# Patient Record
Sex: Male | Born: 1970 | Race: White | Hispanic: No | Smoking: Former smoker
Health system: Southern US, Community
[De-identification: ages and names within clinical notes are randomized; demographics above are authoritative.]

## PROBLEM LIST (undated history)

## (undated) HISTORY — PX: TENDON REPAIR: SHX5111

## (undated) HISTORY — PX: WISDOM TOOTH EXTRACTION: SHX21

---

## 2009-10-17 ENCOUNTER — Ambulatory Visit: Payer: Self-pay | Admitting: Orthopedic Surgery

## 2009-10-18 ENCOUNTER — Ambulatory Visit: Payer: Self-pay | Admitting: Orthopedic Surgery

## 2012-08-20 ENCOUNTER — Ambulatory Visit: Payer: Self-pay | Admitting: General Surgery

## 2012-08-20 LAB — COMPREHENSIVE METABOLIC PANEL
Albumin: 4.4 g/dL (ref 3.4–5.0)
Anion Gap: 7 (ref 7–16)
BUN: 15 mg/dL (ref 7–18)
Bilirubin,Total: 1.2 mg/dL — ABNORMAL HIGH (ref 0.2–1.0)
Calcium, Total: 9.2 mg/dL (ref 8.5–10.1)
Chloride: 106 mmol/L (ref 98–107)
Co2: 25 mmol/L (ref 21–32)
EGFR (African American): >60
EGFR (Non-African Amer.): >60
Potassium: 3.9 mmol/L (ref 3.5–5.1)
SGOT(AST): 16 U/L (ref 15–37)
Sodium: 138 mmol/L (ref 136–145)
Total Protein: 7.5 g/dL (ref 6.4–8.2)

## 2012-08-20 LAB — URINALYSIS, COMPLETE
Ketone: NEGATIVE
Nitrite: NEGATIVE
Ph: 7 (ref 4.5–8.0)
Protein: NEGATIVE
Specific Gravity: 1.003 (ref 1.003–1.030)
WBC UR: <1 /HPF (ref 0–5)

## 2013-05-09 ENCOUNTER — Ambulatory Visit: Payer: Self-pay | Admitting: Family Medicine

## 2013-05-10 ENCOUNTER — Other Ambulatory Visit: Payer: Self-pay | Admitting: Orthopedic Surgery

## 2013-05-10 DIAGNOSIS — M79644 Pain in right finger(s): Secondary | ICD-10-CM

## 2013-05-19 ENCOUNTER — Ambulatory Visit
Admission: RE | Admit: 2013-05-19 | Discharge: 2013-05-19 | Disposition: A | Discharge status: Home / Self Care | Payer: Managed Care, Other (non HMO) | Source: Ambulatory Visit | Attending: Orthopedic Surgery | Admitting: Orthopedic Surgery

## 2013-05-19 ENCOUNTER — Other Ambulatory Visit: Payer: Self-pay | Admitting: Orthopedic Surgery

## 2013-05-19 DIAGNOSIS — Z77018 Contact with and (suspected) exposure to other hazardous metals: Secondary | ICD-10-CM

## 2013-05-19 DIAGNOSIS — M79644 Pain in right finger(s): Secondary | ICD-10-CM

## 2013-05-19 MED ORDER — IOHEXOL 180 MG/ML  SOLN
1.5000 mL | Freq: Once | INTRAMUSCULAR | Status: AC | PRN
  Administered 2013-05-19: 0.5 mL via INTRA_ARTICULAR

## 2013-10-31 ENCOUNTER — Emergency Department: Payer: Self-pay | Admitting: Emergency Medicine

## 2015-02-12 ENCOUNTER — Ambulatory Visit: Payer: Self-pay | Admitting: Family Medicine

## 2015-02-12 LAB — HEPATIC FUNCTION PANEL
ALK PHOS: 75 U/L (ref 25–125)
ALT: 15 U/L (ref 10–40)
AST: 20 U/L (ref 14–40)
Bilirubin, Total: 1.5 mg/dL

## 2015-02-12 LAB — BASIC METABOLIC PANEL
BUN: 12 mg/dL (ref 4–21)
Creatinine: 1 mg/dL (ref 0.6–1.3)
Glucose: 93 mg/dL
POTASSIUM: 4.7 mmol/L (ref 3.4–5.3)
SODIUM: 141 mmol/L (ref 137–147)

## 2015-02-12 LAB — CBC AND DIFFERENTIAL
HEMATOCRIT: 44 % (ref 41–53)
HEMOGLOBIN: 15.2 g/dL (ref 13.5–17.5)
Neutrophils Absolute: 4 /uL
Platelets: 293 10*3/uL (ref 150–399)
WBC: 5.5 10^3/mL

## 2015-02-12 LAB — LIPID PANEL
CHOLESTEROL: 239 mg/dL — AB (ref 0–200)
HDL: 52 mg/dL (ref 35–70)
LDL Cholesterol: 154 mg/dL
LDl/HDL Ratio: 3
Triglycerides: 167 mg/dL — AB (ref 40–160)

## 2015-02-12 LAB — PSA: PSA: 2.8

## 2015-02-12 LAB — TSH: TSH: 1.57 u[IU]/mL (ref 0.41–5.90)

## 2015-04-10 NOTE — Op Note (Signed)
PATIENT NAME:  Christian Jensen, [name] MR#:  [id number] DATE OF BIRTH:  [birth year]  DATE OF PROCEDURE:  08/20/2012  PREOPERATIVE DIAGNOSIS: Bilateral inguinal hernias.   POSTOPERATIVE DIAGNOSIS: Right inguinal hernias (direct) and lipoma of the cord.   OPERATIVE PROCEDURE: Bilateral direct inguinal hernia repair making use of a medium Ultrapro mesh.   SURGEON: Donnalee CurryJeffrey Lenay Lovejoy, MD   ANESTHESIA: General endotracheal under Dr. Noralyn Pickarroll, Exparel 20 mL local infiltration, 1% Xylocaine with 1:200,000 units epinephrine 30 mL local infiltration.   ESTIMATED BLOOD LOSS: Less than 5 mL.   CLINICAL NOTE: This 44 year old male has developed a symptomatic left inguinal hernia. Clinical exam showed bilateral inguinal hernias. He was admitted for elective repair. He received Kefzol prior to the procedure.   OPERATIVE NOTE: With the patient under adequate general endotracheal anesthesia and with hair previously removed with clippers, the area was prepped with ChloraPrep and draped. The left groin, which was more symptomatic, was approached first. A 5 cm skin line incision was made over the planned course of the inguinal canal and was carried down through the skin and subcutaneous tissue with hemostasis achieved by electrocautery and 3-0 Vicryl ties. The external oblique was opened in the direction of its fibers. The cord was mobilized. Dissection at the level of the internal inguinal ring showed no evidence of an indirect sac. There was a small lipoma of the cord which was excised and discarded. Hemostasis was with 3-0 Vicryl ligature. The medial aspect of the posterior canal was lax and the tissue was opened to allow exposure to the preperitoneal space. A medium Ultrapro mesh was smoothed into position and the external component laid along the inguinal canal. This was anchored to the pubic tubercle with 0 Surgilon and then along the inguinal ligament with interrupted 0 Surgilon sutures. The medial and superior borders were  anchored to the transverse abdominis aponeurosis. A lateral slit was made for cord passage. Exparel was placed into the wound at this time. 15 mL of the Xylocaine with epinephrine had been used for the skin and field block anesthesia at the initiation of the procedure. The wound was closed in layers with 2-0 Vicryl to the external oblique, 3-0 Vicryl to the Scarpa's fascia and a running 4-0 Vicryl subcuticular suture for the skin. Benzoin, Steri-Strips, Telfa, and Tegaderm dressing was then applied.   Attention was then turned to the right groin. Field block anesthesia was once again placed with Xylocaine. A 5 cm incision was made and the dissection was completed as described for the left side. Once again a small direct hernia with lipoma of the cord was identified. No indirect sac. The medium Ultrapro mesh was again placed in the preperitoneal space and the external component anchored, as noted above, on the left side. The wound was closed in layers as noted for the left side. Benzoin, Steri-Strips, Telfa, and Tegaderm dressing was then applied.   The patient tolerated the procedure well and was taken to the recovery room in stable condition.  ____________________________ Earline MayotteJeffrey W. Karissa Meenan, MD jwb:slb D: 08/20/2012 14:05:14 ET T: 08/20/2012 14:15:13 ET JOB#: [id number]  cc: Earline MayotteJeffrey W. Sheila Ocasio, MD, <Dictator> Richard L. Sullivan LoneGilbert, MD Eliah Ozawa Brion AlimentW Tadarrius Burch MD ELECTRONICALLY SIGNED 08/20/2012 14:36

## 2015-10-19 ENCOUNTER — Ambulatory Visit (INDEPENDENT_AMBULATORY_CARE_PROVIDER_SITE_OTHER): Payer: Managed Care, Other (non HMO) | Admitting: Family Medicine

## 2015-10-19 ENCOUNTER — Encounter: Payer: Self-pay | Admitting: Family Medicine

## 2015-10-19 VITALS — BP 124/70 | HR 99 | Temp 99.2°F | Resp 16 | Ht 71.75 in | Wt 175.0 lb

## 2015-10-19 DIAGNOSIS — M71122 Other infective bursitis, left elbow: Secondary | ICD-10-CM | POA: Diagnosis not present

## 2015-10-19 DIAGNOSIS — M71129 Other infective bursitis, unspecified elbow: Secondary | ICD-10-CM | POA: Insufficient documentation

## 2015-10-19 DIAGNOSIS — M25522 Pain in left elbow: Secondary | ICD-10-CM

## 2015-10-19 NOTE — Progress Notes (Signed)
Patient ID: Christian Jensen, male   DOB: 10/07/1971, 44 y.o.   MRN: [id number]         Patient: Christian Jensen Male    DOB: 10/01/1971   44 y.o.   MRN: [id number] Visit Date: 10/19/2015  Today's Provider: Lorie PhenixNancy Kieffer Blatz, MD   Chief Complaint  Patient presents with  . Elbow Pain   Subjective:    Arm Pain  The incident occurred 2 days ago. There was no injury mechanism. The pain is present in the left elbow. The quality of the pain is described as aching (Throbbing.). The pain is at a severity of 8/10. The pain is severe. The pain has been constant since the incident. The symptoms are aggravated by movement and palpation. He has tried nothing for the symptoms.      Allergies not on file Previous Medications   OMEPRAZOLE 20 MG TBEC    Take by mouth.    Review of Systems  Constitutional: Positive for fever (Low grade fever.) and fatigue. Negative for chills, diaphoresis, activity change, appetite change and unexpected weight change.  Musculoskeletal: Positive for joint swelling and arthralgias. Negative for myalgias, back pain, gait problem, neck pain and neck stiffness.    Social History  Substance Use Topics  . Smoking status: Current Every Day Smoker  . Smokeless tobacco: Never Used  . Alcohol Use: Yes     Comment: Occasional    Objective:   BP 124/70 mmHg  Pulse 99  Temp(Src) 99.2 F (37.3 C) (Oral)  Resp 16  Ht 5' 11.75" (1.822 m)  Wt 175 lb (79.379 kg)  BMI 23.91 kg/m2  Physical Exam  Constitutional: He is oriented to person, place, and time. He appears well-developed and well-nourished.  Cardiovascular: Normal rate and regular rhythm.   Pulmonary/Chest: Effort normal and breath sounds normal.  Musculoskeletal: He exhibits edema and tenderness.  Erythema and tender in left olecranon bursa.    Neurological: He is alert and oriented to person, place, and time.        Assessment & Plan:     1. Elbow pain, left Secondary to bursitis.   2. Septic olecranon  bursitis, left Patient with mildly elevated heart rate and temperature. Concerning for infection. Will refer to Orthopedics today.  - Ambulatory referral to Orthopedic Surgery     Lorie PhenixNancy Einar Nolasco, MD  [facility]

## 2015-10-22 ENCOUNTER — Other Ambulatory Visit
Admission: RE | Admit: 2015-10-22 | Discharge: 2015-10-22 | Disposition: A | Discharge status: Home / Self Care | Payer: Managed Care, Other (non HMO) | Source: Other Acute Inpatient Hospital | Attending: Specialist | Admitting: Specialist

## 2015-10-22 DIAGNOSIS — Z029 Encounter for administrative examinations, unspecified: Secondary | ICD-10-CM | POA: Insufficient documentation

## 2015-10-24 LAB — WOUND CULTURE

## 2015-10-26 LAB — ANAEROBIC CULTURE

## 2016-02-21 ENCOUNTER — Encounter: Payer: Self-pay | Admitting: Family Medicine

## 2016-02-21 ENCOUNTER — Ambulatory Visit (INDEPENDENT_AMBULATORY_CARE_PROVIDER_SITE_OTHER): Payer: Managed Care, Other (non HMO) | Admitting: Family Medicine

## 2016-02-21 VITALS — BP 132/88 | HR 82 | Temp 97.7°F | Resp 16 | Wt 188.0 lb

## 2016-02-21 DIAGNOSIS — J029 Acute pharyngitis, unspecified: Secondary | ICD-10-CM | POA: Diagnosis not present

## 2016-02-21 DIAGNOSIS — R61 Generalized hyperhidrosis: Secondary | ICD-10-CM | POA: Insufficient documentation

## 2016-02-21 DIAGNOSIS — I861 Scrotal varices: Secondary | ICD-10-CM | POA: Insufficient documentation

## 2016-02-21 DIAGNOSIS — K469 Unspecified abdominal hernia without obstruction or gangrene: Secondary | ICD-10-CM | POA: Insufficient documentation

## 2016-02-21 LAB — POCT RAPID STREP A (OFFICE): Rapid Strep A Screen: NEGATIVE

## 2016-02-21 NOTE — Progress Notes (Signed)
Patient ID: Christian Jensen, male   DOB: 01/31/1971, 45 y.o.   MRN: [id number]    Subjective:  HPI Sore throat: Pt reports that he started having a sore throat 2 days ago. He reports that his throat looks red and he saw white spots on the back of his throat. He denies fevers, chills or body aches. He does have congestion and cough and has been " feeling hot". He has been taking OTC cold meds.   Prior to Admission medications   Medication Sig Start Date End Date Taking? Authorizing Provider  Omeprazole 20 MG TBEC Take by mouth.   Yes Historical Provider, MD    Patient Active Problem List   Diagnosis Date Noted  . Abdominal hernia 02/21/2016  . 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 Night sweat 02/21/2016  . Septic olecranon bursitis 10/19/2015  . Esophagitis, reflux 09/25/2008  . Carbuncle and furuncle 05/11/2008    History reviewed. No pertinent past medical history.  Social History   Social History  . Marital Status: Divorced    Spouse Name: N/A  . Number of Children: N/A  . Years of Education: N/A   Occupational History  . Not on file.   Social History Main Topics  . Smoking status: Former Smoker    Quit date: 10/23/2015  . Smokeless tobacco: Not on file  . Alcohol Use: Yes     Comment: Occasional   . Drug Use: No  . Sexual Activity: Not on file   Other Topics Concern  . Not on file   Social History Narrative    Not on File  Review of Systems  Constitutional: Positive for malaise/fatigue.  HENT: Positive for congestion and sore throat.   Eyes: Negative.   Respiratory: Positive for cough.   Cardiovascular: Negative.   Gastrointestinal: Negative.   Genitourinary: Negative.   Musculoskeletal: Negative.   Skin: Negative.   Neurological: Negative.   Endo/Heme/Allergies: Negative.   Psychiatric/Behavioral: Negative.     Immunization History  Administered Date(s) Administered  . Tdap 09/25/2008   Objective:  BP 132/88 mmHg  Pulse 82  Temp(Src) 97.7 F  (36.5 C) (Oral)  Resp 16  Wt 188 lb (85.276 kg)  SpO2 99%  Physical Exam  Constitutional: He is oriented to person, place, and time and well-developed, well-nourished, and in no distress.  HENT:  Head: Normocephalic and atraumatic.  Right Ear: External ear normal.  Left Ear: External ear normal.  Nose: Nose normal.  Mouth/Throat: Oropharynx is clear and moist.  Cobblestoning and erythema appearance and the uvula is mildly inflamed and swollen.  Eyes: Conjunctivae are normal.  Neck: Neck supple.  Cardiovascular: Normal rate, regular rhythm and normal heart sounds.   Pulmonary/Chest: Effort normal and breath sounds normal.  Abdominal: Soft.  Neurological: He is alert and oriented to person, place, and time.  Skin: Skin is warm and dry.  Psychiatric: Mood, memory, affect and judgment normal.    Lab Results  Component Value Date   WBC 5.5 02/12/2015   HGB 15.2 02/12/2015   HCT 44 02/12/2015   PLT 293 02/12/2015   GLUCOSE 105* 08/20/2012   CHOL 239* 02/12/2015   TRIG 167* 02/12/2015   HDL 52 02/12/2015   LDLCALC 154 02/12/2015   TSH 1.57 02/12/2015   PSA 2.8 02/12/2015    CMP     Component Value Date/Time   NA 141 02/12/2015   NA 138 08/20/2012 0819   K 4.7 02/12/2015   K 3.9 08/20/2012 0819   CL 106  08/20/2012 0819   CO2 25 08/20/2012 0819   GLUCOSE 105* 08/20/2012 0819   BUN 12 02/12/2015   BUN 15 08/20/2012 0819   CREATININE 1.0 02/12/2015   CREATININE 0.80 08/20/2012 0819   CALCIUM 9.2 08/20/2012 0819   PROT 7.5 08/20/2012 0819   ALBUMIN 4.4 08/20/2012 0819   AST 20 02/12/2015   AST 16 08/20/2012 0819   ALT 15 02/12/2015   ALT 29 08/20/2012 0819   ALKPHOS 75 02/12/2015   ALKPHOS 77 08/20/2012 0819   BILITOT 1.2* 08/20/2012 0819   GFRNONAA >60 08/20/2012 0819   GFRAA >60 08/20/2012 0819    Assessment and Plan :  1. Sore throat URI with viral pharyngitis and uvulitis. Push fluids and plenty of rest. Antibodies will not be helpful at this time. -  POCT rapid strep A--negative.  I have done the exam and reviewed the above chart and it is accurate to the best of my knowledge.  Julieanne Manson MD [facility] 02/21/2016 10:11 AM

## 2016-07-18 ENCOUNTER — Other Ambulatory Visit: Payer: Self-pay | Admitting: Orthopedic Surgery

## 2016-07-18 DIAGNOSIS — S46212A Strain of muscle, fascia and tendon of other parts of biceps, left arm, initial encounter: Secondary | ICD-10-CM

## 2016-07-24 ENCOUNTER — Ambulatory Visit
Admission: RE | Admit: 2016-07-24 | Discharge: 2016-07-24 | Disposition: A | Discharge status: Home / Self Care | Payer: Managed Care, Other (non HMO) | Source: Ambulatory Visit | Attending: Orthopedic Surgery | Admitting: Orthopedic Surgery

## 2016-07-24 DIAGNOSIS — M67824 Other specified disorders of tendon, left elbow: Secondary | ICD-10-CM | POA: Insufficient documentation

## 2016-07-24 DIAGNOSIS — S46212A Strain of muscle, fascia and tendon of other parts of biceps, left arm, initial encounter: Secondary | ICD-10-CM | POA: Diagnosis not present

## 2018-01-28 ENCOUNTER — Encounter: Payer: Self-pay | Admitting: Family Medicine

## 2018-01-28 ENCOUNTER — Ambulatory Visit: Payer: Managed Care, Other (non HMO) | Admitting: Family Medicine

## 2018-01-28 VITALS — BP 122/80 | HR 106 | Temp 97.7°F | Resp 16 | Ht 72.0 in | Wt 176.0 lb

## 2018-01-28 DIAGNOSIS — M436 Torticollis: Secondary | ICD-10-CM | POA: Diagnosis not present

## 2018-01-28 MED ORDER — CYCLOBENZAPRINE HCL 10 MG PO TABS: 10.0000 mg | ORAL_TABLET | Freq: Three times a day (TID) | ORAL | 1 refills | Status: DC | PRN

## 2018-01-28 MED ORDER — HYDROCODONE-ACETAMINOPHEN 5-325 MG PO TABS
1.0000 | ORAL_TABLET | Freq: Four times a day (QID) | ORAL | 0 refills | Status: DC | PRN
Start: 2018-01-28 — End: 2018-02-05

## 2018-01-28 MED ORDER — PREDNISONE 10 MG (21) PO TBPK: ORAL_TABLET | ORAL | 0 refills | Status: DC

## 2018-01-28 NOTE — Progress Notes (Signed)
Patient: Christian RaymondJason M Jensen Male    DOB: 06/12/1971   47 y.o.   MRN: [id number] Visit Date: 01/28/2018  Today's Provider: Megan Mansichard Zayli Villafuerte Jr, MD   Chief Complaint  Patient presents with  . Shoulder Pain   Subjective:    HPI Pt reports that he started having right shoulder pain about 2 weeks ago and he had a bad creak in his neck and he went to the chiropractor and back and leg are straight but his neck is still not straight. He is now having pain in his shoulder area now. He reports that he has been out of work for the last 3 days because of this. He thought maybe moving might help it so he did some yard work and that made it worse. The chiropractor said that he had pulled something in his arm. He reports that he can not sleep at night because he can not get comfortable. He reports that he has been taking ibuprofen and it is not working at all.       Not on File   Current Outpatient Medications:  .  Omeprazole 20 MG TBEC, Take by mouth., Disp: , Rfl:   Review of Systems  Constitutional: Negative.   HENT: Negative.   Eyes: Negative.   Respiratory: Negative.   Cardiovascular: Negative.   Gastrointestinal: Negative.   Endocrine: Negative.   Genitourinary: Negative.   Musculoskeletal: Positive for arthralgias, back pain, myalgias, neck pain and neck stiffness.  Skin: Negative.   Allergic/Immunologic: Negative.   Neurological: Negative.   Hematological: Negative.   Psychiatric/Behavioral: Negative.     Social History   Tobacco Use  . Smoking status: Current Every Day Smoker    Packs/day: 0.25    Last attempt to quit: 10/23/2015    Years since quitting: 2.2  . Smokeless tobacco: Never Used  Substance Use Topics  . Alcohol use: Yes    Comment: weekends   Objective:   BP 122/80 (BP Location: Left Arm, Patient Position: Sitting, Cuff Size: Normal)   Pulse (!) 106   Temp 97.7 F (36.5 C) (Oral)   Resp 16   Ht 6' (1.829 m)   Wt 176 lb (79.8 kg)   BMI 23.87 kg/m   Vitals:   01/28/18 0821  BP: 122/80  Pulse: (!) 106  Resp: 16  Temp: 97.7 F (36.5 C)  TempSrc: Oral  Weight: 176 lb (79.8 kg)  Height: 6' (1.829 m)     Physical Exam  Constitutional: He is oriented to person, place, and time. He appears well-developed and well-nourished.  HENT:  Head: Normocephalic and atraumatic.  Right Ear: External ear normal.  Left Ear: External ear normal.  Nose: Nose normal.  Eyes: Conjunctivae are normal.  Neck: No thyromegaly present.  Cardiovascular: Normal rate, regular rhythm and normal heart sounds.  Pulmonary/Chest: Effort normal and breath sounds normal.  Abdominal: Soft.  Neurological: He is alert and oriented to person, place, and time.  Skin: Skin is warm and dry.  Psychiatric: He has a normal mood and affect. His behavior is normal. Judgment and thought content normal.        Assessment & Plan:     1. Torticollis  - HYDROcodone-acetaminophen (NORCO/VICODIN) 5-325 MG tablet; Take 1 tablet by mouth every 6 (six) hours as needed for moderate pain.  Dispense: 25 tablet; Refill: 0 - cyclobenzaprine (FLEXERIL) 10 MG tablet; Take 1 tablet (10 mg total) by mouth 3 (three) times daily as needed for muscle spasms.  Dispense: 30 tablet; Refill: 1 - predniSONE (STERAPRED UNI-PAK 21 TAB) 10 MG (21) TBPK tablet; Take 6 tablets on the first day then decrease by one daily until finished.  Dispense: 21 tablet; Refill: 0      I have done the exam and reviewed the above chart and it is accurate to the best of my knowledge. Dentist has been used in this note in any air is in the dictation or transcription are unintentional.  Megan Mans, MD  [facility] .

## 2018-02-02 ENCOUNTER — Telehealth: Payer: Self-pay | Admitting: Family Medicine

## 2018-02-02 NOTE — Telephone Encounter (Signed)
Patient states that he was in for a appointment on 01/28/2018 and you gave him prednisone for his shoulder.  He states that it seemed to help get the inflammation down but it is still swollen and has a knot on it.  He states that it is really bothering him today and has tingling in his arm and fingers and is hard to grip with his right arm.  He would like to know if there is something else you can send in for him.  [facility] [place].

## 2018-02-03 NOTE — Telephone Encounter (Signed)
Appt made

## 2018-02-03 NOTE — Telephone Encounter (Signed)
Can I see him tomorrow at 11 or in afternoon. May need further evaluation.

## 2018-02-03 NOTE — Telephone Encounter (Signed)
Pt called back because he hasn't heard back about [facility]. Please advise. Thanks TNP

## 2018-02-04 ENCOUNTER — Ambulatory Visit
Admission: RE | Admit: 2018-02-04 | Discharge: 2018-02-04 | Disposition: A | Discharge status: Home / Self Care | Payer: Managed Care, Other (non HMO) | Source: Ambulatory Visit | Attending: Family Medicine | Admitting: Family Medicine

## 2018-02-04 ENCOUNTER — Ambulatory Visit: Payer: Managed Care, Other (non HMO) | Admitting: Family Medicine

## 2018-02-04 VITALS — BP 122/82 | HR 92 | Temp 98.2°F | Resp 18 | Wt 181.0 lb

## 2018-02-04 DIAGNOSIS — M5412 Radiculopathy, cervical region: Secondary | ICD-10-CM

## 2018-02-04 DIAGNOSIS — M50322 Other cervical disc degeneration at C5-C6 level: Secondary | ICD-10-CM | POA: Diagnosis not present

## 2018-02-04 DIAGNOSIS — M8580 Other specified disorders of bone density and structure, unspecified site: Secondary | ICD-10-CM | POA: Diagnosis not present

## 2018-02-04 MED ORDER — DIAZEPAM 5 MG PO TABS: 5.0000 mg | ORAL_TABLET | Freq: Three times a day (TID) | ORAL | 0 refills | Status: DC | PRN

## 2018-02-04 MED ORDER — DIAZEPAM 5 MG PO TABS: 5.0000 mg | ORAL_TABLET | Freq: Four times a day (QID) | ORAL | 0 refills | Status: DC | PRN

## 2018-02-04 NOTE — Progress Notes (Signed)
Patient: Christian Jensen Male    DOB: 09/03/1971   46 y.o.   MRN: [id number] Visit Date: 02/04/2018  Today's Provider: Megan Mansichard [name], MD   Chief Complaint  Patient presents with  . Shoulder Pain   Subjective:    HPI Pt is here today to follow up on his shoulder he was seen on 01/28/18 for torticollis and given Norco # 25, flexeril and Prednisone. He reports that he is still hurting and it is more in his right shoulder now. He has numbness down his arm and fingers. He thinks it is a pinches nerve. He took the prednisone and he has taken all but 3 of the Norco left. The flexeril did help the muscle spasm in his upper shoulder and neck area but when he stopped taking them, it came back. Pt also reports that he can not sleep due to this, he can not seem to get comfortable in any position.       Not on File   Current Outpatient Medications:  .  cyclobenzaprine (FLEXERIL) 10 MG tablet, Take 1 tablet (10 mg total) by mouth 3 (three) times daily as needed for muscle spasms., Disp: 30 tablet, Rfl: 1 .  HYDROcodone-acetaminophen (NORCO/VICODIN) 5-325 MG tablet, Take 1 tablet by mouth every 6 (six) hours as needed for moderate pain., Disp: 25 tablet, Rfl: 0 .  Omeprazole 20 MG TBEC, Take by mouth., Disp: , Rfl:  .  predniSONE (STERAPRED UNI-PAK 21 TAB) 10 MG (21) TBPK tablet, Take 6 tablets on the first day then decrease by one daily until finished. (Patient not taking: Reported on 02/04/2018), Disp: 21 tablet, Rfl: 0  Review of Systems  Constitutional: Negative.   HENT: Negative.   Eyes: Negative.   Respiratory: Negative.   Cardiovascular: Negative.   Gastrointestinal: Negative.   Endocrine: Negative.   Genitourinary: Negative.   Musculoskeletal: Positive for arthralgias and myalgias.  Skin: Negative.   Allergic/Immunologic: Negative.   Neurological: Negative.   Hematological: Negative.   Psychiatric/Behavioral: Positive for sleep disturbance.    Social History   Tobacco  Use  . Smoking status: Current Every Day Smoker    Packs/day: 0.25    Last attempt to quit: 10/23/2015    Years since quitting: 2.2  . Smokeless tobacco: Never Used  Substance Use Topics  . Alcohol use: Yes    Comment: weekends   Objective:   BP 122/82 (BP Location: Left Arm, Patient Position: Sitting, Cuff Size: Normal)   Pulse 92   Temp 98.2 F (36.8 C) (Oral)   Resp 18   Wt 181 lb (82.1 kg)   BMI 24.55 kg/m  Vitals:   02/04/18 1138  BP: 122/82  Pulse: 92  Resp: 18  Temp: 98.2 F (36.8 C)  TempSrc: Oral  Weight: 181 lb (82.1 kg)     Physical Exam  Constitutional: He is oriented to person, place, and time. He appears well-developed and well-nourished.  HENT:  Head: Normocephalic and atraumatic.  Right Ear: External ear normal.  Left Ear: External ear normal.  Nose: Nose normal.  Eyes: Conjunctivae are normal. No scleral icterus.  Neck: No thyromegaly present.  Cardiovascular: Normal rate, regular rhythm and normal heart sounds.  Pulmonary/Chest: Effort normal and breath sounds normal.  Abdominal: Soft.  Neurological: He is alert and oriented to person, place, and time.  Right arm--5-/5 grip                    5-/5 biceps  5/5 triceps  Skin: Skin is warm and dry.  Psychiatric: He has a normal mood and affect. His behavior is normal. Judgment and thought content normal.        Assessment & Plan:     1. Cervical radiculopathy  - MR CERVICAL SPINE W CONTRAST; Future - diazepam (VALIUM) 5 MG tablet; Take 1 tablet (5 mg total) by mouth 3 (three) times daily as needed.  Dispense: 45 tablet; Refill: 0 - DG Cervical Spine Complete; Future      I have done the exam and reviewed the chart and it is accurate to the best of my knowledge. Dentist has been used and  any errors in dictation or transcription are unintentional. Julieanne Manson M.D. [facility]    Megan Mans, MD    [facility]

## 2018-02-05 ENCOUNTER — Telehealth: Payer: Self-pay | Admitting: Emergency Medicine

## 2018-02-05 ENCOUNTER — Telehealth: Payer: Self-pay | Admitting: Family Medicine

## 2018-02-05 DIAGNOSIS — M436 Torticollis: Secondary | ICD-10-CM

## 2018-02-05 MED ORDER — HYDROCODONE-ACETAMINOPHEN 5-325 MG PO TABS: 1.0000 | ORAL_TABLET | Freq: Four times a day (QID) | ORAL | 0 refills | Status: DC | PRN

## 2018-02-05 NOTE — Telephone Encounter (Signed)
-----   Message from Maple Hudsonichard L Gilbert Jr., MD sent at 02/04/2018  3:19 PM EST ----- DDD --f/u with MRI which is ordered.

## 2018-02-05 NOTE — Telephone Encounter (Signed)
Pt advised of results. Pt would like to know if he could get more pain medication. He had told Dr. Sullivan LoneGilbert yesterday he did not think they were working but he is out of them now and this morning is hurting much worse, so thinks they may have been working. Do you mind giving him this to at least get him by for the weekend until Dr. Sullivan LoneGilbert is back in? Please advise. Thanks.

## 2018-02-05 NOTE — Telephone Encounter (Signed)
I need office note finished to get approval for MRI.Thanks

## 2018-02-05 NOTE — Telephone Encounter (Signed)
Pt advised rx up front 

## 2018-02-05 NOTE — Telephone Encounter (Signed)
Will refill with #10 to get him through the weekend.  Will let Dr Sullivan LoneGilbert decide if he would like to continue at that time.  Patient can come by and pick up Rx at his convenience  Bacigalupo, Marzella SchleinAngela M, MD, MPH [facility] 02/05/2018 11:05 AM

## 2018-02-08 NOTE — Telephone Encounter (Signed)
done

## 2018-02-10 ENCOUNTER — Telehealth: Payer: Self-pay | Admitting: Family Medicine

## 2018-02-10 DIAGNOSIS — M5412 Radiculopathy, cervical region: Secondary | ICD-10-CM

## 2018-02-10 MED ORDER — OXYCODONE HCL 5 MG PO CAPS: 5.0000 mg | ORAL_CAPSULE | ORAL | 0 refills | Status: DC | PRN

## 2018-02-10 NOTE — Telephone Encounter (Signed)
Pt is F/B Dr. Sullivan LoneGilbert.

## 2018-02-10 NOTE — Telephone Encounter (Signed)
Please advise. Thanks.  

## 2018-02-10 NOTE — Telephone Encounter (Signed)
rx printed. Pt advised.

## 2018-02-10 NOTE — Telephone Encounter (Signed)
D/c vicodin--try Oxycodone 5mg  q 4 hrs until MRI done.

## 2018-02-10 NOTE — Telephone Encounter (Signed)
Pt is requesting something for his neck pain.He states he has been unable to sleep at night, can't get comfortable.He states the hydrocodone did not help to much and wonders if there is something different that can be prescribed.We are still waiting on auth for MRI

## 2018-02-11 ENCOUNTER — Telehealth: Payer: Self-pay | Admitting: Family Medicine

## 2018-02-11 NOTE — Telephone Encounter (Signed)
Insurance company would like peer to peer to get MRI cervical spine approved Phone [phone].  Case # [id number]

## 2018-02-11 NOTE — Telephone Encounter (Signed)
Please call for peer to peer. Thanks.

## 2018-02-15 ENCOUNTER — Ambulatory Visit
Admission: RE | Admit: 2018-02-15 | Discharge: 2018-02-15 | Disposition: A | Discharge status: Home / Self Care | Payer: Managed Care, Other (non HMO) | Source: Ambulatory Visit | Attending: Family Medicine | Admitting: Family Medicine

## 2018-02-15 ENCOUNTER — Other Ambulatory Visit: Payer: Self-pay | Admitting: Emergency Medicine

## 2018-02-15 DIAGNOSIS — M4802 Spinal stenosis, cervical region: Secondary | ICD-10-CM | POA: Diagnosis not present

## 2018-02-15 DIAGNOSIS — M50222 Other cervical disc displacement at C5-C6 level: Secondary | ICD-10-CM | POA: Insufficient documentation

## 2018-02-15 DIAGNOSIS — M5412 Radiculopathy, cervical region: Secondary | ICD-10-CM

## 2018-02-15 DIAGNOSIS — M50123 Cervical disc disorder at C6-C7 level with radiculopathy: Secondary | ICD-10-CM | POA: Diagnosis not present

## 2018-02-15 NOTE — Progress Notes (Signed)
Per MRI department

## 2018-02-16 ENCOUNTER — Telehealth: Payer: Self-pay | Admitting: *Deleted

## 2018-02-16 NOTE — Telephone Encounter (Signed)
-----   Message from Maple Hudsonichard L Gilbert Jr., MD sent at 02/16/2018  8:13 AM EST ----- DDD--if pt still having symptoms would refer to Dr Lovell SheehanJenkins from neurosurgery.

## 2018-02-16 NOTE — Telephone Encounter (Signed)
Patient was notified of results. Expressed understanding. Patient stated that he is still having numbness in his arm and fingers. He would like to be referred to a specialist. However he would like to see someone in [place]. Please advise?

## 2018-02-16 NOTE — Telephone Encounter (Signed)
[place] has neurosurgeons but [facility]. [place] would go to [place]But  [place] is ok if pt prefers that.

## 2018-02-16 NOTE — Telephone Encounter (Signed)
Pt called back to discuss if there was someone he could see in [place]. Pt was advised as below. Pt was also given the number to Dr. Lovell SheehanJenkins office because pt had questions about office processes of Dr. Lovell SheehanJenkins. Pt stated he would call our office back once [facility] or Dr. Lovell SheehanJenkins. Please advise. Thanks TNP

## 2018-02-18 ENCOUNTER — Telehealth: Payer: Self-pay | Admitting: Family Medicine

## 2018-02-18 DIAGNOSIS — M5030 Other cervical disc degeneration, unspecified cervical region: Secondary | ICD-10-CM

## 2018-02-18 DIAGNOSIS — M5412 Radiculopathy, cervical region: Secondary | ICD-10-CM

## 2018-02-18 MED ORDER — OXYCODONE HCL 10 MG PO TABS: 10.0000 mg | ORAL_TABLET | Freq: Four times a day (QID) | ORAL | 0 refills | Status: DC

## 2018-02-18 NOTE — Telephone Encounter (Signed)
Pt stated that he would like to go ahead with seeing Dr. [place]ell [place]Jenkins in [place]. Pt stated that if Dr. Lovell SheehanJenkins can do a cortisone injection at his first visit that would be great because his shoulder is really hurting and pt said he had to stay out of work yesterday because of the pain. Please advise. Thanks TNP

## 2018-02-18 NOTE — Telephone Encounter (Signed)
per Dr. Sullivan LoneGilbert Verbal order increase Oxycodone to 10 mg q6h PRN. #55. Printed. Pt informed. rx upfront ready for pick up.

## 2018-02-18 NOTE — Telephone Encounter (Signed)
Pt advised.

## 2018-02-18 NOTE — Telephone Encounter (Signed)
Pt states that the oxycodone (OXY-IR) 5 MG capsule that was prescribed is not helping with his pain.He wants to know if there is anything that can be prescribed or over the counter that may help until he can get in with Dr Lovell SheehanJenkins

## 2018-02-18 NOTE — Telephone Encounter (Signed)
Please advise. Thanks.  

## 2018-05-11 ENCOUNTER — Encounter: Payer: Self-pay | Admitting: Family Medicine

## 2018-05-11 ENCOUNTER — Ambulatory Visit: Payer: Managed Care, Other (non HMO) | Admitting: Family Medicine

## 2018-05-11 VITALS — BP 130/82 | HR 104 | Temp 98.5°F | Resp 16 | Ht 72.0 in | Wt 180.0 lb

## 2018-05-11 DIAGNOSIS — J069 Acute upper respiratory infection, unspecified: Secondary | ICD-10-CM

## 2018-05-11 MED ORDER — AMOXICILLIN-POT CLAVULANATE 875-125 MG PO TABS: 1.0000 | ORAL_TABLET | Freq: Two times a day (BID) | ORAL | 0 refills | Status: DC

## 2018-05-11 NOTE — Progress Notes (Signed)
  Subjective:     Patient ID: Christian Jensen, male   DOB: 11-13-1971, 47 y.o.   MRN: [id number] Chief Complaint  Patient presents with  . Sinus Problem    X 1 week    HPI Reports cold sx onset 5-6 days ago. States he continues to have ear pressure, right sided sinus pressure, and purulent sinus drainage. Smokes 1/4 ppd.  Review of Systems     Objective:   Physical Exam  Constitutional: He appears well-developed and well-nourished. No distress.  Psychiatric:  anxious  Ears: T.M's intact without inflammation Sinuses: mild right maxillary sinus tenderness Throat: no tonsillar enlargement or exudate Neck: no cervical adenopathy Lungs: clear     Assessment:    1. Acute URI     Plan:    Discussed sx treatment. If sinuses not improving over the next two days to start Augmentin 875 q 12 hours rx provided. Work excuse for the remainder of today through tomorrow.

## 2018-05-11 NOTE — Patient Instructions (Signed)
Start Mucinex D for congestion and Delsym for cough. If sinuses not clearing over the next two days start the antibiotic. Stop smoking while ill.

## 2018-07-13 ENCOUNTER — Ambulatory Visit: Payer: Managed Care, Other (non HMO) | Admitting: Family Medicine

## 2018-07-13 ENCOUNTER — Encounter: Payer: Self-pay | Admitting: Family Medicine

## 2018-07-13 VITALS — BP 120/64 | HR 80 | Temp 98.4°F | Resp 16 | Ht 72.0 in | Wt 181.0 lb

## 2018-07-13 DIAGNOSIS — L309 Dermatitis, unspecified: Secondary | ICD-10-CM | POA: Diagnosis not present

## 2018-07-13 DIAGNOSIS — B880 Other acariasis: Secondary | ICD-10-CM | POA: Diagnosis not present

## 2018-07-13 MED ORDER — PREDNISONE 10 MG PO TABS: 10.0000 mg | ORAL_TABLET | Freq: Every day | ORAL | 0 refills | Status: DC

## 2018-07-13 MED ORDER — METHYLPREDNISOLONE ACETATE 80 MG/ML IJ SUSP: 80.0000 mg | Freq: Once | INTRAMUSCULAR | Status: DC

## 2018-07-13 MED ORDER — CETIRIZINE HCL 10 MG PO TABS
10.0000 mg | ORAL_TABLET | Freq: Every day | ORAL | 11 refills | Status: DC
Start: 2018-07-13 — End: 2019-01-21

## 2018-07-13 NOTE — Progress Notes (Signed)
Patient: Christian Jensen Male    DOB: 07/20/1971   46 y.o.   MRN: [id number] Visit Date: 07/13/2018  Today's Provider: Megan Mansichard Dominion Kathan Jr, MD   Chief Complaint  Patient presents with  . Insect Bite   Subjective:    HPI Patient comes in today c/o insect bites. He reports that he has had symptoms for the last 2 weeks. He reports that the bites are located all throughout his body. They seem to be worse around his ankles. He has used Benadryl cream, but it does not relieve the itching.  He has fields behind his home with long grass.He has had t chase his dogs at least twice recently.    Allergies  Allergen Reactions  . Bacitracin Rash and Swelling     Current Outpatient Medications:  .  Omeprazole 20 MG TBEC, Take by mouth., Disp: , Rfl:  .  amoxicillin-clavulanate (AUGMENTIN) 875-125 MG tablet, Take 1 tablet by mouth 2 (two) times daily. (Patient not taking: Reported on 07/13/2018), Disp: 20 tablet, Rfl: 0 .  cyclobenzaprine (FLEXERIL) 10 MG tablet, Take 1 tablet (10 mg total) by mouth 3 (three) times daily as needed for muscle spasms. (Patient not taking: Reported on 07/13/2018), Disp: 30 tablet, Rfl: 1 .  diazepam (VALIUM) 5 MG tablet, Take 1 tablet (5 mg total) by mouth 3 (three) times daily as needed. (Patient not taking: Reported on 07/13/2018), Disp: 45 tablet, Rfl: 0 .  oxycodone (OXY-IR) 5 MG capsule, Take 1 capsule (5 mg total) by mouth every 4 (four) hours as needed. (Patient not taking: Reported on 07/13/2018), Disp: 35 capsule, Rfl: 0 .  Oxycodone HCl 10 MG TABS, Take 1 tablet (10 mg total) by mouth every 6 (six) hours. (Patient not taking: Reported on 07/13/2018), Disp: 55 tablet, Rfl: 0  Review of Systems  Constitutional: Negative for activity change, appetite change, chills, diaphoresis, fatigue, fever and unexpected weight change.  Respiratory: Negative for cough, shortness of breath and stridor.   Cardiovascular: Negative for chest pain, palpitations and leg swelling.    Musculoskeletal: Positive for myalgias.  Skin: Positive for color change, rash and wound. Negative for pallor.  Neurological: Negative for dizziness, weakness and headaches.  Psychiatric/Behavioral: Negative.     Social History   Tobacco Use  . Smoking status: Current Every Day Smoker    Packs/day: 0.25    Last attempt to quit: 10/23/2015    Years since quitting: 2.7  . Smokeless tobacco: Never Used  Substance Use Topics  . Alcohol use: Yes    Comment: weekends   Objective:   BP 120/64 (BP Location: Left Arm, Patient Position: Sitting, Cuff Size: Normal)   Pulse 80   Temp 98.4 F (36.9 C)   Resp 16   Ht 6' (1.829 m)   Wt 181 lb (82.1 kg)   SpO2 98%   BMI 24.55 kg/m  Vitals:   07/13/18 0925  BP: 120/64  Pulse: 80  Resp: 16  Temp: 98.4 F (36.9 C)  SpO2: 98%  Weight: 181 lb (82.1 kg)  Height: 6' (1.829 m)     Physical Exam  Constitutional: He is oriented to person, place, and time. He appears well-developed and well-nourished.  HENT:  Head: Normocephalic and atraumatic.  Eyes: Conjunctivae are normal. No scleral icterus.  Neck: No thyromegaly present.  Cardiovascular: Normal rate, regular rhythm, normal heart sounds and intact distal pulses.  Pulmonary/Chest: Effort normal and breath sounds normal.  Abdominal: Soft.  Musculoskeletal: He exhibits no  edema.  Lymphadenopathy:    He has no cervical adenopathy.  Neurological: He is alert and oriented to person, place, and time.  Skin: Skin is warm and dry.  Significant rash c/w chiggers without secondary infection--much worse around ankles and lower legs--little above waist and none on face.  Psychiatric: He has a normal mood and affect. His behavior is normal. Judgment and thought content normal.        Assessment & Plan:     Dermatitis/Chiggers Rx Zyrtec/Caladryl topical/Benadryl as tolerated/Depomedrol 80mg  IM today. Prednisone 10mg --6 day taper if not improving in 2 days.      Anquanette Bahner Wendelyn Breslow, MD   [facility]

## 2018-07-13 NOTE — Patient Instructions (Signed)
Use Benadryl  OTC 25 mg  1-2 every 4 hours as tolerated. Use Caladryl or Calamine topical lotion as needed  Start Prednisone if not better by the end of the week

## 2019-01-21 ENCOUNTER — Encounter: Payer: Self-pay | Admitting: Family Medicine

## 2019-01-21 ENCOUNTER — Ambulatory Visit: Payer: Managed Care, Other (non HMO) | Admitting: Family Medicine

## 2019-01-21 VITALS — BP 130/90 | HR 89 | Temp 98.5°F | Resp 16 | Wt 175.0 lb

## 2019-01-21 DIAGNOSIS — J069 Acute upper respiratory infection, unspecified: Secondary | ICD-10-CM

## 2019-01-21 MED ORDER — AMOXICILLIN 500 MG PO CAPS: 1000.0000 mg | ORAL_CAPSULE | Freq: Two times a day (BID) | ORAL | 0 refills | Status: AC

## 2019-01-21 NOTE — Progress Notes (Signed)
Patient: Christian Jensen Male    DOB: February 21, 1971   48 y.o.   MRN: [id number] Visit Date: 01/21/2019  Today's Provider: Mila Merry, MD   Chief Complaint  Patient presents with  . Sinus Problem   Subjective:     Sinus Problem  This is a new problem. Episode onset: 2 weeks ago. Associated symptoms include congestion (sinus congestion), ear pain (left ear), headaches, neck pain (posterior ) and sinus pressure. Pertinent negatives include no chills, coughing, diaphoresis, hoarse voice, shortness of breath, sneezing, sore throat or swollen glands. (Mucus production) Treatments tried: BC powder. The treatment provided moderate relief.    Allergies  Allergen Reactions  . Bacitracin Rash and Swelling     Current Outpatient Medications:  .  Omeprazole 20 MG TBEC, Take by mouth., Disp: , Rfl:  .  diazepam (VALIUM) 5 MG tablet, Take 1 tablet (5 mg total) by mouth 3 (three) times daily as needed. (Patient not taking: Reported on 07/13/2018), Disp: 45 tablet, Rfl: 0  Review of Systems  Constitutional: Negative for appetite change, chills, diaphoresis and fever.  HENT: Positive for congestion (sinus congestion), ear discharge (left ear), ear pain (left ear) and sinus pressure. Negative for hoarse voice, sneezing and sore throat.   Respiratory: Negative for cough, chest tightness, shortness of breath and wheezing.   Cardiovascular: Negative for chest pain and palpitations.  Gastrointestinal: Negative for abdominal pain, nausea and vomiting.  Musculoskeletal: Positive for neck pain (posterior ).  Skin: Positive for rash (right lower leg).  Neurological: Positive for headaches.    Social History   Tobacco Use  . Smoking status: Current Every Day Smoker    Packs/day: 0.05    Last attempt to quit: 10/23/2015    Years since quitting: 3.2  . Smokeless tobacco: Never Used  Substance Use Topics  . Alcohol use: Yes    Comment: weekends      Objective:   BP 130/90 (BP Location: Left  Arm, Patient Position: Sitting, Cuff Size: Large)   Pulse 89   Temp 98.5 F (36.9 C) (Oral)   Resp 16   Wt 175 lb (79.4 kg)   SpO2 99% Comment: room air  BMI 23.73 kg/m  Vitals:   01/21/19 1557  BP: 130/90  Pulse: 89  Resp: 16  Temp: 98.5 F (36.9 C)  TempSrc: Oral  SpO2: 99%  Weight: 175 lb (79.4 kg)    Physical Exam   General Appearance:    Alert, cooperative, no distress  HENT:   ENT exam normal, no neck nodes or sinus tenderness  Eyes:    PERRL, conjunctiva/corneas clear, EOM's intact       Lungs:     Clear to auscultation bilaterally, respirations unlabored  Heart:    Regular rate and rhythm  Neurologic:   Awake, alert, oriented x 3. No apparent focal neurological           defect.          Assessment & Plan    1. Upper respiratory tract infection, unspecified type Has improved since clearing out nose today. Counseled regarding signs and symptoms of viral and bacterial respiratory infections. Advised him that he can start prescription for antibiotic if he develops any sign of bacterial infection, or if current symptoms last longer than 10 days.   He also has itchy rash on his lower leg. He wanted to know if should take some left over prednisone for it. He tried OTc neosporin and antifungal cream.  Suggested he try OTC 1% hydrocortisone first.      Mila Merry, MD  [facility]

## 2019-01-21 NOTE — Patient Instructions (Signed)
   Fill antibiotic prescription if you develop any fever over 101, shortness of breath, worsening ear pain or if not feeling better in 4-5 days.

## 2020-06-13 NOTE — Progress Notes (Signed)
    Inetta Fermo Cummings,acting as a scribe for Megan Mans, MD.,have documented all relevant documentation on the behalf of Megan Mans, MD,as directed by  Megan Mans, MD while in the presence of Megan Mans, MD.  Established patient visit   Patient: Christian Jensen   DOB: Aug 04, 1971   48 y.o. Male  MRN: [id number] Visit Date: 06/14/2020  Today's healthcare provider: Megan Mans, MD   Chief Complaint  Patient presents with  . Blood Pressure Check   Subjective    HPI  Patient presents today in office to have his blood pressure checked for his insurance. He feels well and has no complaints. He has a slight rash in his right axilla which is getting better.      Medications: Outpatient Medications Prior to Visit  Medication Sig  . [DISCONTINUED] Omeprazole 20 MG TBEC Take by mouth.  . diazepam (VALIUM) 5 MG tablet Take 1 tablet (5 mg total) by mouth 3 (three) times daily as needed. (Patient not taking: Reported on 07/13/2018)   No facility-administered medications prior to visit.    Review of Systems  Constitutional: Negative for appetite change, chills and fever.  Respiratory: Negative for chest tightness, shortness of breath and wheezing.   Cardiovascular: Negative for chest pain and palpitations.  Gastrointestinal: Negative for abdominal pain, nausea and vomiting.       Objective    BP 124/86 (BP Location: Left Arm, Cuff Size: Large)   Pulse (!) 58   Temp (!) 97.3 F (36.3 C) (Temporal)   Ht 6' (1.829 m)   Wt 173 lb 12.8 oz (78.8 kg)   BMI 23.57 kg/m     BP Readings from Last 3 Encounters:  06/14/20 131/87  01/21/19 130/90  07/13/18 120/64   Wt Readings from Last 3 Encounters:  06/14/20 173 lb 12.8 oz (78.8 kg)  01/21/19 175 lb (79.4 kg)  07/13/18 181 lb (82.1 kg)      Physical Exam    No results found for any visits on 06/14/20.  Assessment & Plan     1. Gastroesophageal reflux disease, unspecified whether  esophagitis present Start omeprazole 20 mg daily. - omeprazole (PRILOSEC) 20 MG capsule; Take 1 capsule (20 mg total) by mouth daily.  Dispense: 90 capsule; Refill: 3 Blood pressures okay today patient to check blood pressure at home and bring in readings along with cuff on next visit. 2. Rash Use Hydrocortisone Cream for Rash.  Return in 23 weeks (on 11/22/2020) for CPE.      I, Megan Mans, MD, have reviewed all documentation for this visit. The documentation on 06/21/20 for the exam, diagnosis, procedures, and orders are all accurate and complete.    Emmaus Brandi Wendelyn Breslow, MD  [facility] [phone] (phone) [phone] (fax)  [facility]

## 2020-06-14 ENCOUNTER — Ambulatory Visit: Payer: Managed Care, Other (non HMO) | Admitting: Family Medicine

## 2020-06-14 ENCOUNTER — Other Ambulatory Visit: Payer: Self-pay

## 2020-06-14 ENCOUNTER — Encounter: Payer: Self-pay | Admitting: *Deleted

## 2020-06-14 ENCOUNTER — Encounter: Payer: Self-pay | Admitting: Family Medicine

## 2020-06-14 VITALS — BP 131/87 | HR 58 | Temp 97.3°F | Ht 72.0 in | Wt 173.8 lb

## 2020-06-14 DIAGNOSIS — R21 Rash and other nonspecific skin eruption: Secondary | ICD-10-CM | POA: Diagnosis not present

## 2020-06-14 DIAGNOSIS — K219 Gastro-esophageal reflux disease without esophagitis: Secondary | ICD-10-CM | POA: Diagnosis not present

## 2020-06-14 MED ORDER — OMEPRAZOLE 20 MG PO CPDR: 20.0000 mg | DELAYED_RELEASE_CAPSULE | Freq: Every day | ORAL | 3 refills | Status: AC

## 2020-06-14 NOTE — Patient Instructions (Signed)
Use Hydrocortisone Cream for Rash.

## 2020-11-22 ENCOUNTER — Ambulatory Visit (INDEPENDENT_AMBULATORY_CARE_PROVIDER_SITE_OTHER): Payer: Self-pay | Admitting: Family Medicine

## 2020-11-22 ENCOUNTER — Encounter: Payer: Self-pay | Admitting: Family Medicine

## 2020-11-22 ENCOUNTER — Other Ambulatory Visit: Payer: Self-pay

## 2020-11-22 VITALS — BP 126/82 | HR 62 | Temp 98.0°F | Resp 16 | Wt 180.0 lb

## 2020-11-22 DIAGNOSIS — Z125 Encounter for screening for malignant neoplasm of prostate: Secondary | ICD-10-CM

## 2020-11-22 DIAGNOSIS — K219 Gastro-esophageal reflux disease without esophagitis: Secondary | ICD-10-CM

## 2020-11-22 DIAGNOSIS — Z23 Encounter for immunization: Secondary | ICD-10-CM

## 2020-11-22 DIAGNOSIS — Z1211 Encounter for screening for malignant neoplasm of colon: Secondary | ICD-10-CM

## 2020-11-22 DIAGNOSIS — Z1389 Encounter for screening for other disorder: Secondary | ICD-10-CM

## 2020-11-22 DIAGNOSIS — Z0000 Encounter for general adult medical examination without abnormal findings: Secondary | ICD-10-CM

## 2020-11-22 LAB — POCT URINALYSIS DIPSTICK
Bilirubin, UA: NEGATIVE
Blood, UA: NEGATIVE
Glucose, UA: NEGATIVE
Ketones, UA: NEGATIVE
Leukocytes, UA: NEGATIVE
Nitrite, UA: NEGATIVE
Protein, UA: NEGATIVE
Spec Grav, UA: 1.01 (ref 1.010–1.025)
Urobilinogen, UA: 0.2 E.U./dL
pH, UA: 7 (ref 5.0–8.0)

## 2020-11-22 LAB — HEMOCCULT GUIAC POC 1CARD (OFFICE): Fecal Occult Blood, POC: NEGATIVE

## 2020-11-22 NOTE — Progress Notes (Signed)
I,Roshena L Chambers,acting as a scribe for Megan Mans, MD.,have documented all relevant documentation on the behalf of Megan Mans, MD,as directed by  Megan Mans, MD while in the presence of Megan Mans, MD.  Complete physical exam   Patient: Christian Jensen   DOB: 1971/07/07   49 y.o. Male  MRN: [id number] Visit Date: 11/22/2020  Today's healthcare provider: Megan Mans, MD   Chief Complaint  Patient presents with  . Annual Exam   Subjective    Christian Jensen is a 49 y.o. male who presents today for a complete physical exam.  He reports consuming a general diet. The patient does not participate in regular exercise at present. He generally feels fairly well. He reports sleeping fairly well. He does not have additional problems to discuss today.  HPI  Patient states he needs omeprazole daily for reflux symptoms.  Are controlled with PPI.  No past medical history on file. Past Surgical History:  Procedure Laterality Date  . TENDON REPAIR     bicep tendon  . WISDOM TOOTH EXTRACTION     Social History   Socioeconomic History  . Marital status: Divorced    Spouse name: Not on file  . Number of children: Not on file  . Years of education: Not on file  . Highest education level: Not on file  Occupational History  . Not on file  Tobacco Use  . Smoking status: Current Every Day Smoker    Packs/day: 0.05    Last attempt to quit: 10/23/2015    Years since quitting: 5.1  . Smokeless tobacco: Never Used  Vaping Use  . Vaping Use: Never used  Substance and Sexual Activity  . Alcohol use: Yes    Comment: weekends  . Drug use: No  . Sexual activity: Not on file  Other Topics Concern  . Not on file  Social History Narrative  . Not on file   Social Determinants of Health   Financial Resource Strain: Not on file  Food Insecurity: Not on file  Transportation Needs: Not on file  Physical Activity: Not on file  Stress: Not on file   Social Connections: Not on file  Intimate Partner Violence: Not on file   Family Status  Relation Name Status  . Mother  Alive  . Father  Alive  . MGF  (Not Specified)  . PGM  (Not Specified)   Family History  Problem Relation Age of Onset  . Lung disease Mother   . Hypertension Father   . Depression Father   . Heart disease Maternal Grandfather   . Stroke Paternal Grandmother    Allergies  Allergen Reactions  . Bacitracin Rash and Swelling    Patient Care Team: Maple Hudson., MD as PCP - General (Family Medicine)   Medications: Outpatient Medications Prior to Visit  Medication Sig  . omeprazole (PRILOSEC) 20 MG capsule Take 1 capsule (20 mg total) by mouth daily.  . [DISCONTINUED] diazepam (VALIUM) 5 MG tablet Take 1 tablet (5 mg total) by mouth 3 (three) times daily as needed. (Patient not taking: Reported on 07/13/2018)   No facility-administered medications prior to visit.    Review of Systems  Constitutional: Positive for diaphoresis (at night). Negative for appetite change, chills, fatigue and fever.  HENT: Negative for congestion, ear pain, hearing loss, nosebleeds and trouble swallowing.   Eyes: Negative for pain and visual disturbance.  Respiratory: Negative for cough, chest tightness and  shortness of breath.   Cardiovascular: Negative for chest pain, palpitations and leg swelling.  Gastrointestinal: Negative for abdominal pain, blood in stool, constipation, diarrhea, nausea and vomiting.  Endocrine: Negative for polydipsia, polyphagia and polyuria.  Genitourinary: Negative for dysuria and flank pain.  Musculoskeletal: Negative for arthralgias, back pain, joint swelling, myalgias and neck stiffness.  Skin: Negative for color change, rash and wound.  Neurological: Negative for dizziness, tremors, seizures, speech difficulty, weakness, light-headedness and headaches.       Twitching of the eyelids  Psychiatric/Behavioral: Negative for behavioral  problems, confusion, decreased concentration, dysphoric mood and sleep disturbance. The patient is not nervous/anxious.   All other systems reviewed and are negative.      Objective    BP 126/82 (BP Location: Right Arm, Patient Position: Sitting, Cuff Size: Normal)   Pulse 62   Temp 98 F (36.7 C) (Oral)   Resp 16   Wt 180 lb (81.6 kg)   BMI 24.41 kg/m  BP Readings from Last 3 Encounters:  11/22/20 126/82  06/14/20 131/87  01/21/19 130/90   Wt Readings from Last 3 Encounters:  11/22/20 180 lb (81.6 kg)  06/14/20 173 lb 12.8 oz (78.8 kg)  01/21/19 175 lb (79.4 kg)      Physical Exam Vitals reviewed.  Constitutional:      Appearance: Normal appearance. He is well-developed.  HENT:     Head: Normocephalic and atraumatic.     Right Ear: External ear normal.     Left Ear: External ear normal.     Nose: Nose normal.     Mouth/Throat:     Pharynx: Oropharynx is clear.  Eyes:     General: No scleral icterus.    Conjunctiva/sclera: Conjunctivae normal.  Neck:     Thyroid: No thyromegaly.  Cardiovascular:     Rate and Rhythm: Normal rate and regular rhythm.     Heart sounds: Normal heart sounds.  Pulmonary:     Effort: Pulmonary effort is normal.     Breath sounds: Normal breath sounds.  Abdominal:     Palpations: Abdomen is soft.  Genitourinary:    Penis: Normal.      Testes: Normal.     Prostate: Normal.     Rectum: Normal.  Skin:    General: Skin is warm and dry.  Neurological:     General: No focal deficit present.     Mental Status: He is alert and oriented to person, place, and time.  Psychiatric:        Mood and Affect: Mood normal.        Behavior: Behavior normal.        Thought Content: Thought content normal.        Judgment: Judgment normal.       Last depression screening scores PHQ 2/9 Scores 11/22/2020 01/28/2018  PHQ - 2 Score 0 0  PHQ- 9 Score 0 -   Last fall risk screening Fall Risk  11/22/2020  Falls in the past year? 0  Number falls  in past yr: 0  Injury with Fall? 0  Follow up Falls evaluation completed   Last Audit-C alcohol use screening Alcohol Use Disorder Test (AUDIT) 11/22/2020  1. How often do you have a drink containing alcohol? 2  2. How many drinks containing alcohol do you have on a typical day when you are drinking? 0  3. How often do you have six or more drinks on one occasion? 0  AUDIT-C Score 2  Alcohol Brief Interventions/Follow-up  AUDIT Score <7 follow-up not indicated   A score of 3 or more in women, and 4 or more in men indicates increased risk for alcohol abuse, EXCEPT if all of the points are from question 1   Results for orders placed or performed in visit on 11/22/20  Lipid panel  Result Value Ref Range   Cholesterol, Total 247 (H) 100 - 199 mg/dL   Triglycerides 098204 (H) 0 - 149 mg/dL   HDL 49 >11>39 mg/dL   VLDL Cholesterol Cal 37 5 - 40 mg/dL   LDL Chol Calc (NIH) 914161 (H) 0 - 99 mg/dL   Chol/HDL Ratio 5.0 0.0 - 5.0 ratio  CBC w/Diff/Platelet  Result Value Ref Range   WBC 4.3 3.4 - 10.8 x10E3/uL   RBC 5.04 4.14 - 5.80 x10E6/uL   Hemoglobin 15.6 13.0 - 17.7 g/dL   Hematocrit 78.244.8 95.637.5 - 51.0 %   MCV 89 79 - 97 fL   MCH 31.0 26.6 - 33.0 pg   MCHC 34.8 31.5 - 35.7 g/dL   RDW 21.312.5 08.611.6 - 57.815.4 %   Platelets 324 150 - 450 x10E3/uL   Neutrophils 63 Not Estab. %   Lymphs 25 Not Estab. %   Monocytes 6 Not Estab. %   Eos 4 Not Estab. %   Basos 2 Not Estab. %   Neutrophils Absolute 2.7 1.4 - 7.0 x10E3/uL   Lymphocytes Absolute 1.1 0.7 - 3.1 x10E3/uL   Monocytes Absolute 0.3 0.1 - 0.9 x10E3/uL   EOS (ABSOLUTE) 0.2 0.0 - 0.4 x10E3/uL   Basophils Absolute 0.1 0.0 - 0.2 x10E3/uL   Immature Granulocytes 0 Not Estab. %   Immature Grans (Abs) 0.0 0.0 - 0.1 x10E3/uL  Comprehensive Metabolic Panel (CMET)  Result Value Ref Range   Glucose 89 65 - 99 mg/dL   BUN 11 6 - 24 mg/dL   Creatinine, Ser 4.690.82 0.76 - 1.27 mg/dL   GFR calc non Af Amer 104 >59 mL/min/1.73   GFR calc Af Amer 120 >59  mL/min/1.73   BUN/Creatinine Ratio 13 9 - 20   Sodium 141 134 - 144 mmol/L   Potassium 4.5 3.5 - 5.2 mmol/L   Chloride 99 96 - 106 mmol/L   CO2 23 20 - 29 mmol/L   Calcium 10.1 8.7 - 10.2 mg/dL   Total Protein 7.3 6.0 - 8.5 g/dL   Albumin 5.3 (H) 4.0 - 5.0 g/dL   Globulin, Total 2.0 1.5 - 4.5 g/dL   Albumin/Globulin Ratio 2.7 (H) 1.2 - 2.2   Bilirubin Total 1.1 0.0 - 1.2 mg/dL   Alkaline Phosphatase 78 44 - 121 IU/L   AST 21 0 - 40 IU/L   ALT 25 0 - 44 IU/L  TSH  Result Value Ref Range   TSH 1.410 0.450 - 4.500 uIU/mL  PSA  Result Value Ref Range   Prostate Specific Ag, Serum 2.8 0.0 - 4.0 ng/mL  POCT urinalysis dipstick  Result Value Ref Range   Color, UA yellow    Clarity, UA clear    Glucose, UA Negative Negative   Bilirubin, UA negative    Ketones, UA negative    Spec Grav, UA 1.010 1.010 - 1.025   Blood, UA negative    pH, UA 7.0 5.0 - 8.0   Protein, UA Negative Negative   Urobilinogen, UA 0.2 0.2 or 1.0 E.U./dL   Nitrite, UA negative    Leukocytes, UA Negative Negative   Appearance     Odor    POCT Occult  Blood Stool  Result Value Ref Range   Fecal Occult Blood, POC Negative Negative    Assessment & Plan    Routine Health Maintenance and Physical Exam  Exercise Activities and Dietary recommendations Goals   None     Immunization History  Administered Date(s) Administered  . Tdap 09/25/2008, 11/22/2020    Health Maintenance  Topic Date Due  . Hepatitis C Screening  Never done  . COVID-19 Vaccine (1) Never done  . HIV Screening  Never done  . INFLUENZA VACCINE  03/21/2021 (Originally 07/22/2020)  . TETANUS/TDAP  11/22/2030    Discussed health benefits of physical activity, and encouraged him to engage in regular exercise appropriate for his age and condition.  1. Annual physical exam - Lipid panel - CBC w/Diff/Platelet - Comprehensive Metabolic Panel (CMET) - TSH  2. Gastroesophageal reflux disease, unspecified whether esophagitis  present Controlled on Omeprazole. Patient counseled to take medication daily.   3. Screening for blood or protein in urine - POCT urinalysis dipstick  4. Need for tetanus, diphtheria, and acellular pertussis (Tdap) vaccine in patient of adolescent age or older - Tdap vaccine greater than or equal to 7yo IM  5. Screening for prostate cancer - PSA  6. Screening for colon cancer - POCT Occult Blood Stool  No follow-ups on file.        Toyna Erisman Wendelyn Breslow, MD  [facility] [phone] (phone) [phone] (fax)  [facility]

## 2020-11-23 LAB — TSH: TSH: 1.41 u[IU]/mL (ref 0.450–4.500)

## 2020-11-23 LAB — COMPREHENSIVE METABOLIC PANEL
ALT: 25 IU/L (ref 0–44)
AST: 21 IU/L (ref 0–40)
Albumin/Globulin Ratio: 2.7 — ABNORMAL HIGH (ref 1.2–2.2)
Albumin: 5.3 g/dL — ABNORMAL HIGH (ref 4.0–5.0)
Alkaline Phosphatase: 78 IU/L (ref 44–121)
BUN/Creatinine Ratio: 13 (ref 9–20)
BUN: 11 mg/dL (ref 6–24)
Bilirubin Total: 1.1 mg/dL (ref 0.0–1.2)
CO2: 23 mmol/L (ref 20–29)
Calcium: 10.1 mg/dL (ref 8.7–10.2)
Chloride: 99 mmol/L (ref 96–106)
Creatinine, Ser: 0.82 mg/dL (ref 0.76–1.27)
GFR calc Af Amer: 120 mL/min/{1.73_m2} (ref >59)
GFR calc non Af Amer: 104 mL/min/{1.73_m2} (ref >59)
Globulin, Total: 2 g/dL (ref 1.5–4.5)
Glucose: 89 mg/dL (ref 65–99)
Potassium: 4.5 mmol/L (ref 3.5–5.2)
Sodium: 141 mmol/L (ref 134–144)
Total Protein: 7.3 g/dL (ref 6.0–8.5)

## 2020-11-23 LAB — LIPID PANEL
Chol/HDL Ratio: 5 ratio (ref 0.0–5.0)
Cholesterol, Total: 247 mg/dL — ABNORMAL HIGH (ref 100–199)
HDL: 49 mg/dL (ref >39)
LDL Chol Calc (NIH): 161 mg/dL — ABNORMAL HIGH (ref 0–99)
Triglycerides: 204 mg/dL — ABNORMAL HIGH (ref 0–149)
VLDL Cholesterol Cal: 37 mg/dL (ref 5–40)

## 2020-11-23 LAB — CBC WITH DIFFERENTIAL/PLATELET
Basophils Absolute: 0.1 10*3/uL (ref 0.0–0.2)
Basos: 2 %
EOS (ABSOLUTE): 0.2 10*3/uL (ref 0.0–0.4)
Eos: 4 %
Hematocrit: 44.8 % (ref 37.5–51.0)
Hemoglobin: 15.6 g/dL (ref 13.0–17.7)
Immature Grans (Abs): 0 10*3/uL (ref 0.0–0.1)
Immature Granulocytes: 0 %
Lymphocytes Absolute: 1.1 10*3/uL (ref 0.7–3.1)
Lymphs: 25 %
MCH: 31 pg (ref 26.6–33.0)
MCHC: 34.8 g/dL (ref 31.5–35.7)
MCV: 89 fL (ref 79–97)
Monocytes Absolute: 0.3 10*3/uL (ref 0.1–0.9)
Monocytes: 6 %
Neutrophils Absolute: 2.7 10*3/uL (ref 1.4–7.0)
Neutrophils: 63 %
Platelets: 324 10*3/uL (ref 150–450)
RBC: 5.04 x10E6/uL (ref 4.14–5.80)
RDW: 12.5 % (ref 11.6–15.4)
WBC: 4.3 10*3/uL (ref 3.4–10.8)

## 2020-11-23 LAB — PSA: Prostate Specific Ag, Serum: 2.8 ng/mL (ref 0.0–4.0)

## 2020-11-27 ENCOUNTER — Telehealth: Payer: Self-pay

## 2020-11-27 NOTE — Telephone Encounter (Signed)
Left message advising pt of lab results (Per DPR)  Thanks,   -Vernona Rieger

## 2020-11-27 NOTE — Telephone Encounter (Signed)
-----   Message from Maple Hudson., MD sent at 11/23/2020  8:12 AM EST ----- Labs stable.  Work on diet and exercise for mildly elevated cholesterol.

## 2020-11-28 NOTE — Telephone Encounter (Signed)
Patient returned call- advised of lab result and PCP recommendation- patient states he is trying to watch his diet and exercise- he will discuss other changes he ca do with PCP when he sees him again.

## 2021-07-17 ENCOUNTER — Ambulatory Visit: Payer: Self-pay | Admitting: *Deleted

## 2021-07-17 NOTE — Telephone Encounter (Signed)
During our conversation the patient ran a home covid test and result was positive. Fever for 2 days. Lower back, intermittent depending on movement. Mild HA and ST.  Patient missed work yesterday, today and now will be home for isolation the duration of this week. He is requesting a note for missed work and the remainder of this week due to Covid/symptoms-stated in the past his boss has asked employees to come in even when sick. Discussed isolation, fluids, and health precautions.   Offered e-visit with [facility] but he declined.  Phone # on file is correct. Please call patient if able to provide a letter.  Answer Assessment - Initial Assessment Questions Lower back pain 2. LOCATION: "Where does it hurt?" (upper, mid or lower back)     Monday 3. SEVERITY: "How bad is the pain?"  (e.g., Scale 1-10; mild, moderate, or severe)   - MILD (1-3): doesn't interfere with normal activities    - MODERATE (4-7): interferes with normal activities or awakens from sleep    - SEVERE (8-10): excruciating pain, unable to do any normal activities      Mild to moderate 4. PATTERN: "Is the pain constant?" (e.g., yes, no; constant, intermittent)      Intermittent based on movement 5. RADIATION: "Does the pain shoot into your legs or elsewhere?"     no 6. CAUSE:  "What do you think is causing the back pain?"      unsure 7. BACK OVERUSE:  "Any recent lifting of heavy objects, strenuous work or exercise?"     no 8. MEDICATIONS: "What have you taken so far for the pain?" (e.g., nothing, acetaminophen, NSAIDS)     nothing 9. NEUROLOGIC SYMPTOMS: "Do you have any weakness, numbness, or problems with bowel/bladder control?"     none 10. OTHER SYMPTOMS: "Do you have any other symptoms?" (e.g., fever, abdominal pain, burning with urination, blood in urine)       none 11. PREGNANCY: "Is there any chance you are pregnant?" (e.g., yes, no; LMP)       na  Protocols used: Back Pain-A-AH

## 2021-09-02 ENCOUNTER — Telehealth: Payer: Self-pay

## 2021-09-02 ENCOUNTER — Ambulatory Visit: Payer: Self-pay

## 2021-09-02 NOTE — Telephone Encounter (Signed)
Appointment scheduled.

## 2021-09-02 NOTE — Telephone Encounter (Signed)
No appts available tomorrow. Is this ok to wait until Wed or thrus? Please advise. Thanks!

## 2021-09-02 NOTE — Telephone Encounter (Signed)
Copied from CRM [id number]. Topic: Appointment Scheduling - Scheduling Inquiry for Clinic >> Sep 02, 2021 10:11 AM Fanny Bien wrote: Reason for CRM: Pt called and stated that he has a sore throat with white spots. Pt would like to know if he can be worked in. Please advise

## 2021-09-02 NOTE — Telephone Encounter (Signed)
Summary: possible strep   Pt called and stated that he has a sore throat with white spots. Would like a call back from a nurse. Please advise     Pt. Reports he started having a sore throat Saturday. Getting worse. Hurts to swallow. Has seen white patches in the back of his throat. No fever. Does have ear pain and headache. Reports he had COVID 19 1 1/2 month ago. Has not tested today. No availability. Would like to be worked in. Please advise pt.   Answer Assessment - Initial Assessment Questions 1. ONSET: "When did the throat start hurting?" (Hours or days ago)      Saturday 2. SEVERITY: "How bad is the sore throat?" (Scale 1-10; mild, moderate or severe)   - MILD (1-3):  doesn't interfere with eating or normal activities   - MODERATE (4-7): interferes with eating some solids and normal activities   - SEVERE (8-10):  excruciating pain, interferes with most normal activities   - SEVERE DYSPHAGIA: can't swallow liquids, drooling     Moderate 3. STREP EXPOSURE: "Has there been any exposure to strep within the past week?" If Yes, ask: "What type of contact occurred?"      No 4.  VIRAL SYMPTOMS: "Are there any symptoms of a cold, such as a runny nose, cough, hoarse voice or red eyes?"      Ear pain 5. FEVER: "Do you have a fever?" If Yes, ask: "What is your temperature, how was it measured, and when did it start?"     No 6. PUS ON THE TONSILS: "Is there pus on the tonsils in the back of your throat?"     Yes 7. OTHER SYMPTOMS: "Do you have any other symptoms?" (e.g., difficulty breathing, headache, rash)     Difficulty swallowing, headache 8. PREGNANCY: "Is there any chance you are pregnant?" "When was your last menstrual period?"     N/a  Protocols used: Sore Throat-A-AH

## 2021-09-03 ENCOUNTER — Ambulatory Visit (INDEPENDENT_AMBULATORY_CARE_PROVIDER_SITE_OTHER): Payer: 59 | Admitting: Family Medicine

## 2021-09-03 ENCOUNTER — Other Ambulatory Visit: Payer: Self-pay

## 2021-09-03 ENCOUNTER — Encounter: Payer: Self-pay | Admitting: Family Medicine

## 2021-09-03 VITALS — BP 154/99 | HR 54 | Temp 98.2°F | Wt 177.0 lb

## 2021-09-03 DIAGNOSIS — J029 Acute pharyngitis, unspecified: Secondary | ICD-10-CM | POA: Diagnosis not present

## 2021-09-03 DIAGNOSIS — H6503 Acute serous otitis media, bilateral: Secondary | ICD-10-CM

## 2021-09-03 DIAGNOSIS — J069 Acute upper respiratory infection, unspecified: Secondary | ICD-10-CM

## 2021-09-03 MED ORDER — PREDNISONE 20 MG PO TABS: 20.0000 mg | ORAL_TABLET | Freq: Every day | ORAL | 0 refills | Status: AC

## 2021-09-03 NOTE — Progress Notes (Deleted)
    Virtual telephone visit    Virtual Visit via Telephone Note   This visit type was conducted due to national recommendations for restrictions regarding the COVID-19 Pandemic (e.g. social distancing) in an effort to limit this patient's exposure and mitigate transmission in our community. Due to his co-morbid illnesses, this patient is at least at moderate risk for complications without adequate follow up. This format is felt to be most appropriate for this patient at this time. The patient did not have access to video technology or had technical difficulties with video requiring transitioning to audio format only (telephone). Physical exam was limited to content and character of the telephone converstion.    Patient location: *** Provider location: ***  I discussed the limitations of evaluation and management by telemedicine and the availability of in person appointments. The patient expressed understanding and agreed to proceed.   Visit Date: 09/03/2021  Today's healthcare provider: Megan Mans, MD   No chief complaint on file.  Subjective    HPI  ***    Medications: Outpatient Medications Prior to Visit  Medication Sig   omeprazole (PRILOSEC) 20 MG capsule Take 1 capsule (20 mg total) by mouth daily.   No facility-administered medications prior to visit.    Review of Systems  {Labs  Heme  Chem  Endocrine  Serology  Results Review (optional):23779}  Objective    There were no vitals taken for this visit. {Show previous vital signs (optional):23777}    Assessment & Plan     ***  No follow-ups on file.    I discussed the assessment and treatment plan with the patient. The patient was provided an opportunity to ask questions and all were answered. The patient agreed with the plan and demonstrated an understanding of the instructions.   The patient was advised to call back or seek an in-person evaluation if the symptoms worsen or if the condition  fails to improve as anticipated.  I provided *** minutes of non-face-to-face time during this encounter.  {provider attestation***:1}  Megan Mans, MD [facility] [phone] (phone) [phone] (fax)  [facility]

## 2021-09-03 NOTE — Progress Notes (Signed)
I,April Miller,acting as a scribe for Megan Mans, MD.,have documented all relevant documentation on the behalf of Megan Mans, MD,as directed by  Megan Mans, MD while in the presence of Megan Mans, MD.   Established patient visit   Patient: Christian Jensen   DOB: 17-Aug-1971   50 y.o. Male  MRN: [id number] Visit Date: 09/03/2021  Today's healthcare provider: Megan Mans, MD   Chief Complaint  Patient presents with   Sore Throat   Subjective    Sore Throat  This is a new problem. The current episode started in the past 7 days (3 days). The problem has been gradually worsening. There has been no fever. The pain is at a severity of 5/10. The pain is moderate. Associated symptoms include ear pain, headaches, neck pain, shortness of breath, swollen glands and trouble swallowing. Pertinent negatives include no abdominal pain, congestion, coughing, diarrhea, drooling, ear discharge, hoarse voice, plugged ear sensation, stridor or vomiting. He has had no exposure to strep or mono. Treatments tried: Advil, Nyquil, BC powder, Throat Spray. The treatment provided no relief.    Patient has had a sore throat for 3 days. Patient he has pus pockets and redness in his mouth. Patient also has symptoms of ear pain, neck pain and pain when swallowing. Patient has been treating symptoms with Advil, Nyquil, BC powder, Throat Spray. Patient had covid 1 1/2 months ago.  He has had no cough fever or chills. It is improving over the past few days.    Medications: Outpatient Medications Prior to Visit  Medication Sig   omeprazole (PRILOSEC) 20 MG capsule Take 1 capsule (20 mg total) by mouth daily.   No facility-administered medications prior to visit.    Review of Systems  HENT:  Positive for ear pain and trouble swallowing. Negative for congestion, drooling, ear discharge and hoarse voice.   Respiratory:  Positive for shortness of breath. Negative for cough and  stridor.   Gastrointestinal:  Negative for abdominal pain, diarrhea and vomiting.  Musculoskeletal:  Positive for neck pain.  Neurological:  Positive for headaches.       Objective    There were no vitals taken for this visit. BP Readings from Last 3 Encounters:  09/03/21 (!) 154/99  11/22/20 126/82  06/14/20 131/87   Wt Readings from Last 3 Encounters:  09/03/21 177 lb (80.3 kg)  11/22/20 180 lb (81.6 kg)  06/14/20 173 lb 12.8 oz (78.8 kg)      Physical Exam Vitals reviewed.  Constitutional:      General: He is not in acute distress.    Appearance: He is well-developed.  HENT:     Head: Normocephalic and atraumatic.     Right Ear: Hearing normal.     Left Ear: Hearing normal.     Nose: Nose normal.     Mouth/Throat:     Mouth: Mucous membranes are moist. No oral lesions.     Pharynx: Posterior oropharyngeal erythema present. No oropharyngeal exudate or uvula swelling.  Eyes:     General: Lids are normal. No scleral icterus.       Right eye: No discharge.        Left eye: No discharge.     Conjunctiva/sclera: Conjunctivae normal.  Cardiovascular:     Rate and Rhythm: Normal rate and regular rhythm.     Heart sounds: Normal heart sounds.  Pulmonary:     Effort: Pulmonary effort is normal. No respiratory distress.  Skin:    Findings: No lesion or rash.  Neurological:     General: No focal deficit present.     Mental Status: He is alert and oriented to person, place, and time.  Psychiatric:        Mood and Affect: Mood normal.        Speech: Speech normal.        Behavior: Behavior normal.        Thought Content: Thought content normal.        Judgment: Judgment normal.      No results found for any visits on 09/03/21.  Assessment & Plan     1. Sore throat I think this is a viral URI combined with seasonal allergies.  Symptomatically and add prednisone for a week.  No evidence whatsoever this is a strep throat. - predniSONE (DELTASONE) 20 MG tablet; Take 1  tablet (20 mg total) by mouth daily with breakfast.  Dispense: 5 tablet; Refill: 0  2. Upper respiratory tract infection, unspecified type I do not think this is COVID recurrent. - predniSONE (DELTASONE) 20 MG tablet; Take 1 tablet (20 mg total) by mouth daily with breakfast.  Dispense: 5 tablet; Refill: 0  3. Non-recurrent acute serous otitis media of both ears Treat symptomatically.  Follow-up as needed. - predniSONE (DELTASONE) 20 MG tablet; Take 1 tablet (20 mg total) by mouth daily with breakfast.  Dispense: 5 tablet; Refill: 0   No follow-ups on file.      I, Megan Mans, MD, have reviewed all documentation for this visit. The documentation on 09/08/21 for the exam, diagnosis, procedures, and orders are all accurate and complete.    Sajad Glander Wendelyn Breslow, MD  [facility] [phone] (phone) [phone] (fax)  [facility]

## 2021-09-03 NOTE — Patient Instructions (Signed)
For pus pockets use salt water gargles. Also take over-the-counter vitamin C, vitamin D, and Zinc daly until feeling better.

## 2021-11-27 ENCOUNTER — Encounter: Payer: Self-pay | Admitting: Family Medicine

## 2021-12-02 ENCOUNTER — Ambulatory Visit: Payer: Self-pay

## 2021-12-02 ENCOUNTER — Other Ambulatory Visit: Payer: Self-pay

## 2021-12-02 ENCOUNTER — Emergency Department: Payer: 59

## 2021-12-02 ENCOUNTER — Emergency Department
Admission: EM | Admit: 2021-12-02 | Discharge: 2021-12-02 | Disposition: A | Discharge status: Home / Self Care | Payer: 59 | Attending: Emergency Medicine | Admitting: Emergency Medicine

## 2021-12-02 DIAGNOSIS — R001 Bradycardia, unspecified: Secondary | ICD-10-CM | POA: Diagnosis not present

## 2021-12-02 DIAGNOSIS — R55 Syncope and collapse: Secondary | ICD-10-CM | POA: Diagnosis not present

## 2021-12-02 DIAGNOSIS — R42 Dizziness and giddiness: Secondary | ICD-10-CM | POA: Insufficient documentation

## 2021-12-02 DIAGNOSIS — R002 Palpitations: Secondary | ICD-10-CM | POA: Diagnosis not present

## 2021-12-02 DIAGNOSIS — F17210 Nicotine dependence, cigarettes, uncomplicated: Secondary | ICD-10-CM | POA: Insufficient documentation

## 2021-12-02 LAB — CBC
HCT: 44.7 % (ref 39.0–52.0)
Hemoglobin: 15.5 g/dL (ref 13.0–17.0)
MCH: 31.5 pg (ref 26.0–34.0)
MCHC: 34.7 g/dL (ref 30.0–36.0)
MCV: 90.9 fL (ref 80.0–100.0)
Platelets: 299 10*3/uL (ref 150–400)
RBC: 4.92 MIL/uL (ref 4.22–5.81)
RDW: 12.5 % (ref 11.5–15.5)
WBC: 4.9 10*3/uL (ref 4.0–10.5)
nRBC: 0 % (ref 0.0–0.2)

## 2021-12-02 LAB — BASIC METABOLIC PANEL
Anion gap: 7 (ref 5–15)
BUN: 12 mg/dL (ref 6–20)
CO2: 28 mmol/L (ref 22–32)
Calcium: 9.8 mg/dL (ref 8.9–10.3)
Chloride: 103 mmol/L (ref 98–111)
Creatinine, Ser: 0.82 mg/dL (ref 0.61–1.24)
GFR, Estimated: >60 mL/min (ref >60)
Glucose, Bld: 107 mg/dL — ABNORMAL HIGH (ref 70–99)
Potassium: 4 mmol/L (ref 3.5–5.1)
Sodium: 138 mmol/L (ref 135–145)

## 2021-12-02 LAB — TROPONIN I (HIGH SENSITIVITY)
Troponin I (High Sensitivity): 4 ng/L (ref <18)
Troponin I (High Sensitivity): 4 ng/L (ref <18)

## 2021-12-02 NOTE — ED Triage Notes (Addendum)
Pt comes with c/o left arm numbness and dizziness. Pt states he almost fell out on Friday. Pt states he has been dealing with vertigo but this was different.  Pt states he has just has continued to be dizzy and legs felt heavy. Pt states little blurry vision but then states he has bad eye site.  Pt also states he felt his heart was irregular over weekend.

## 2021-12-02 NOTE — Telephone Encounter (Signed)
  Received call from pt. Pt has been having episodes of left sided weakness and dizziness a few times over the last month.   On Friday pt had a more significant episode. Pt has been feeling a bit off since then. Pt left work early on Friday.  During this episode his left arm starting bending at the elbow.   Per protocol pt should be seen at ED/UC. Pt will have someone come get him and take him to [facility] for evaluation.      Reason for Disposition  [1] Numbness (i.e., loss of sensation) of the face, arm / hand, or leg / foot on one side of the body AND [2] gradual onset (e.g., days to weeks) AND [3] present now  Answer Assessment - Initial Assessment Questions 1. SYMPTOM: "What is the main symptom you are concerned about?" (e.g., weakness, numbness)     Left side weakness/ dizzy 2. ONSET: "When did this start?" (minutes, hours, days; while sleeping)     A few times over the last month 3. LAST NORMAL: "When was the last time you (the patient) were normal (no symptoms)?"     Unsure 4. PATTERN "Does this come and go, or has it been constant since it started?"  "Is it present now?"     Come and go 5. CARDIAC SYMPTOMS: "Have you had any of the following symptoms: chest pain, difficulty breathing, palpitations?"     no 6. NEUROLOGIC SYMPTOMS: "Have you had any of the following symptoms: headache, dizziness, vision loss, double vision, changes in speech, unsteady on your feet?"     Dizziness. 7. OTHER SYMPTOMS: "Do you have any other symptoms?"     Left arm suddenly retracted 8. PREGNANCY: "Is there any chance you are pregnant?" "When was your last menstrual period?"    na  Protocols used: Neurologic Deficit-A-AH

## 2021-12-02 NOTE — ED Provider Notes (Signed)
[facility] Emergency Department Provider Note   ____________________________________________   Event Date/Time   First MD Initiated Contact with Patient 12/02/21 2127     (approximate)  I have reviewed the triage vital signs and the nursing notes.   HISTORY  Chief Complaint Dizziness    HPI Christian Jensen is a 50 y.o. male with past medical history of GERD who presents to the ED complaining of dizziness.  Patient reports that over the past 3 days he has been dealing with intermittent episodes of dizziness and lightheadedness, with a feeling like he is going to pass out.  Symptoms seem to come on at any time and when they are severe, he feels like his heart is racing and beating irregularly.  He denies any associated chest pain or shortness of breath, has not had any fevers, cough, nausea, or vomiting.  He denies any cardiac history and has never had similar symptoms in the past.  He denies any vision changes, speech changes, numbness, or weakness.        History reviewed. No pertinent past medical history.  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 Authorizing Provider  omeprazole (PRILOSEC) 20 MG capsule Take 1 capsule (20 mg total) by mouth daily. 06/14/20   Maple Hudson., MD  predniSONE (DELTASONE) 20 MG tablet Take 1 tablet (20 mg total) by mouth daily with breakfast. 09/03/21   Maple Hudson., MD    Allergies Bacitracin  Family History  Problem Relation Age of Onset   Lung disease Mother    Hypertension Father     Depression Father    Heart disease Maternal Grandfather    Stroke Paternal Grandmother     Social History Social History   Tobacco Use   Smoking status: Every Day    Packs/day: 0.05    Types: Cigarettes    Last attempt to quit: 10/23/2015    Years since quitting: 6.1   Smokeless tobacco: Never  Vaping Use   Vaping Use: Never used  Substance Use Topics   Alcohol use: Yes    Comment: weekends   Drug use: Yes    Types: Marijuana    Comment: yesterday last used    Review of Systems  Constitutional: No fever/chills Eyes: No visual changes. ENT: No sore throat. Cardiovascular: Denies chest pain.  Positive for palpitations.  Positive for dizziness and lightheadedness. Respiratory: Denies shortness of breath. Gastrointestinal: No abdominal pain.  No nausea, no vomiting.  No diarrhea.  No constipation. Genitourinary: Negative for dysuria. Musculoskeletal: Negative for back pain. Skin: Negative for rash. Neurological: Negative for headaches, focal weakness or numbness.  ____________________________________________   PHYSICAL EXAM:  VITAL SIGNS: ED Triage Vitals  Enc Vitals Group     BP 12/02/21 1317 (!) 148/98     Pulse Rate 12/02/21 1317 76     Resp 12/02/21 1317 20     Temp 12/02/21 1317 98.5 F (36.9 C)     Temp src --      SpO2 12/02/21 1317 99 %  Weight --      Height --      Head Circumference --      Peak Flow --      Pain Score 12/02/21 1316 0     Pain Loc --      Pain Edu? --      Excl. in GC? --     Constitutional: Alert and oriented. Eyes: Conjunctivae are normal. Head: Atraumatic. Nose: No congestion/rhinnorhea. Mouth/Throat: Mucous membranes are moist. Neck: Normal ROM Cardiovascular: Normal rate, regular rhythm. Grossly normal heart sounds.  2+ radial pulses bilaterally. Respiratory: Normal respiratory effort.  No retractions. Lungs CTAB. Gastrointestinal: Soft and nontender. No distention. Genitourinary: deferred Musculoskeletal: No  lower extremity tenderness nor edema. Neurologic:  Normal speech and language. No gross focal neurologic deficits are appreciated. Skin:  Skin is warm, dry and intact. No rash noted. Psychiatric: Mood and affect are normal. Speech and behavior are normal.  ____________________________________________   LABS (all labs ordered are listed, but only abnormal results are displayed)  Labs Reviewed  BASIC METABOLIC PANEL - Abnormal; Notable for the following components:      Result Value   Glucose, Bld 107 (*)    All other components within normal limits  CBC  URINALYSIS, ROUTINE W REFLEX MICROSCOPIC  CBG MONITORING, ED  TROPONIN I (HIGH SENSITIVITY)  TROPONIN I (HIGH SENSITIVITY)   ____________________________________________  EKG  ED ECG REPORT I, Chesley Noon, the attending physician, personally viewed and interpreted this ECG.   Date: 12/02/2021  EKG Time: 13:20  Rate: 63  Rhythm: normal sinus rhythm, sinus arrhythmia  Axis: Normal  Intervals:none  ST&T Change: None  ED ECG REPORT I, Chesley Noon, the attending physician, personally viewed and interpreted this ECG.   Date: 12/02/2021  EKG Time: 22:39  Rate: 50  Rhythm: sinus bradycardia  Axis: Normal  Intervals:none  ST&T Change: None    PROCEDURES  Procedure(s) performed (including Critical Care):  Procedures   ____________________________________________   INITIAL IMPRESSION / ASSESSMENT AND PLAN / ED COURSE      50 year old male with past medical history of GERD who presents to the ED complaining of intermittent episodes of dizziness, lightheadedness, near syncope, and palpitations.  He denies any associated chest pain or shortness of breath, he is currently asymptomatic and ambulatory without difficulty.  EKG shows sinus arrhythmia but no ischemic changes.  Patient was observed on cardiac monitor, occasionally with bradycardic rhythm and sinus arrhythmia but no tacky dysrhythmia noted.  Labs are  unremarkable and troponin was within normal limits, no reason to suspect ACS or PE at this time.  He is appropriate for outpatient management and was provided with referral to cardiology to set up Holter monitor.  He was counseled to return to the ED for new worsening symptoms.  Patient agrees with plan.      ____________________________________________   FINAL CLINICAL IMPRESSION(S) / ED DIAGNOSES  Final diagnoses:  Dizziness  Near syncope  Palpitations     ED Discharge Orders     None        Note:  This document was prepared using Dragon voice recognition software and may include unintentional dictation errors.    Chesley Noon, MD 12/02/21 [phone]

## 2021-12-02 NOTE — ED Notes (Signed)
Pt denies current dizziness or lightheadedness. Hooked patient up to cardiac monitor. Repeated 12 lead EKG and gave to EDP.

## 2021-12-30 ENCOUNTER — Encounter: Payer: 59 | Admitting: Family Medicine

## 2022-03-14 ENCOUNTER — Emergency Department: Payer: 59

## 2022-03-14 ENCOUNTER — Encounter: Payer: Self-pay | Admitting: Emergency Medicine

## 2022-03-14 ENCOUNTER — Other Ambulatory Visit: Payer: Self-pay

## 2022-03-14 ENCOUNTER — Inpatient Hospital Stay
Admission: EM | Admit: 2022-03-14 | Discharge: 2022-03-16 | DRG: 494 | Disposition: A | Discharge status: Home / Self Care | Payer: 59 | Attending: Specialist | Admitting: Specialist

## 2022-03-14 DIAGNOSIS — Z823 Family history of stroke: Secondary | ICD-10-CM

## 2022-03-14 DIAGNOSIS — S82831A Other fracture of upper and lower end of right fibula, initial encounter for closed fracture: Secondary | ICD-10-CM | POA: Diagnosis present

## 2022-03-14 DIAGNOSIS — Y929 Unspecified place or not applicable: Secondary | ICD-10-CM | POA: Diagnosis not present

## 2022-03-14 DIAGNOSIS — Z87891 Personal history of nicotine dependence: Secondary | ICD-10-CM

## 2022-03-14 DIAGNOSIS — Z8249 Family history of ischemic heart disease and other diseases of the circulatory system: Secondary | ICD-10-CM | POA: Diagnosis not present

## 2022-03-14 DIAGNOSIS — W3189XA Contact with other specified machinery, initial encounter: Secondary | ICD-10-CM | POA: Diagnosis not present

## 2022-03-14 DIAGNOSIS — Z20822 Contact with and (suspected) exposure to covid-19: Secondary | ICD-10-CM | POA: Diagnosis present

## 2022-03-14 DIAGNOSIS — S82201A Unspecified fracture of shaft of right tibia, initial encounter for closed fracture: Principal | ICD-10-CM

## 2022-03-14 DIAGNOSIS — S82241A Displaced spiral fracture of shaft of right tibia, initial encounter for closed fracture: Principal | ICD-10-CM | POA: Diagnosis present

## 2022-03-14 LAB — BASIC METABOLIC PANEL
Anion gap: 10 (ref 5–15)
BUN: 14 mg/dL (ref 6–20)
CO2: 23 mmol/L (ref 22–32)
Calcium: 8.8 mg/dL — ABNORMAL LOW (ref 8.9–10.3)
Chloride: 105 mmol/L (ref 98–111)
Creatinine, Ser: 0.88 mg/dL (ref 0.61–1.24)
GFR, Estimated: >60 mL/min (ref >60)
Glucose, Bld: 95 mg/dL (ref 70–99)
Potassium: 3.9 mmol/L (ref 3.5–5.1)
Sodium: 138 mmol/L (ref 135–145)

## 2022-03-14 LAB — CBC WITH DIFFERENTIAL/PLATELET
Abs Immature Granulocytes: 0.02 10*3/uL (ref 0.00–0.07)
Basophils Absolute: 0.1 10*3/uL (ref 0.0–0.1)
Basophils Relative: 1 %
Eosinophils Absolute: 0.1 10*3/uL (ref 0.0–0.5)
Eosinophils Relative: 2 %
HCT: 39.4 % (ref 39.0–52.0)
Hemoglobin: 13.7 g/dL (ref 13.0–17.0)
Immature Granulocytes: 0 %
Lymphocytes Relative: 17 %
Lymphs Abs: 1.2 10*3/uL (ref 0.7–4.0)
MCH: 30.4 pg (ref 26.0–34.0)
MCHC: 34.8 g/dL (ref 30.0–36.0)
MCV: 87.6 fL (ref 80.0–100.0)
Monocytes Absolute: 0.4 10*3/uL (ref 0.1–1.0)
Monocytes Relative: 5 %
Neutro Abs: 5.7 10*3/uL (ref 1.7–7.7)
Neutrophils Relative %: 75 %
Platelets: 283 10*3/uL (ref 150–400)
RBC: 4.5 MIL/uL (ref 4.22–5.81)
RDW: 12.3 % (ref 11.5–15.5)
WBC: 7.5 10*3/uL (ref 4.0–10.5)
nRBC: 0 % (ref 0.0–0.2)

## 2022-03-14 LAB — URINALYSIS, ROUTINE W REFLEX MICROSCOPIC
Bilirubin Urine: NEGATIVE
Glucose, UA: NEGATIVE mg/dL
Hgb urine dipstick: NEGATIVE
Ketones, ur: NEGATIVE mg/dL
Leukocytes,Ua: NEGATIVE
Nitrite: NEGATIVE
Protein, ur: NEGATIVE mg/dL
Specific Gravity, Urine: 1.004 — ABNORMAL LOW (ref 1.005–1.030)
pH: 5 (ref 5.0–8.0)

## 2022-03-14 LAB — TYPE AND SCREEN
ABO/RH(D): O POS
Antibody Screen: NEGATIVE

## 2022-03-14 LAB — PROTIME-INR
INR: 1.1 (ref 0.8–1.2)
Prothrombin Time: 14 seconds (ref 11.4–15.2)

## 2022-03-14 MED ORDER — CEFAZOLIN SODIUM-DEXTROSE 2-4 GM/100ML-% IV SOLN
2.0000 g | INTRAVENOUS | Status: AC
  Administered 2022-03-15: 2 g via INTRAVENOUS

## 2022-03-14 MED ORDER — HYDROMORPHONE HCL 1 MG/ML IJ SOLN
1.0000 mg | Freq: Once | INTRAMUSCULAR | Status: AC
  Administered 2022-03-14: 1 mg via INTRAVENOUS
  Filled 2022-03-14: qty 1

## 2022-03-14 MED ORDER — CLINDAMYCIN PHOSPHATE 900 MG/50ML IV SOLN
900.0000 mg | INTRAVENOUS | Status: AC
  Administered 2022-03-15: 900 mg via INTRAVENOUS
  Filled 2022-03-14: qty 50

## 2022-03-14 MED ORDER — MORPHINE SULFATE (PF) 2 MG/ML IV SOLN
1.0000 mg | INTRAVENOUS | Status: DC | PRN
  Administered 2022-03-14 – 2022-03-15 (×2): 1 mg via INTRAVENOUS
  Filled 2022-03-14 (×2): qty 1

## 2022-03-14 MED ORDER — HYDROCODONE-ACETAMINOPHEN 7.5-325 MG PO TABS
1.0000 | ORAL_TABLET | Freq: Four times a day (QID) | ORAL | Status: DC | PRN
  Filled 2022-03-14: qty 1

## 2022-03-14 MED ORDER — SODIUM CHLORIDE 0.9 % IV SOLN: INTRAVENOUS | Status: DC

## 2022-03-14 NOTE — ED Notes (Signed)
This RN and Dr. Angus Palms at bedside for splint application. VS maintained throughout. Pt. Alert and oriented throughout. ?

## 2022-03-14 NOTE — ED Provider Notes (Signed)
? ?[facility] ?Provider Note ? ? ? Event Date/Time  ? First MD Initiated Contact with Patient 03/14/22 1752   ?  (approximate) ? ? ?History  ? ?Leg Injury ? ? ?HPI ? ?Christian Jensen is a 50 y.o. male with no significant past medical history who comes ED complaining of right lower leg pain, sudden onset and severe which started while trying to use a hand truck to pull a large [place] in his yard for his fire pit.  He notes inability to bear weight on the leg after the injury.  Denies any other injury, no chest pain or shortness of breath. ? ?Last ate at about 11:00 AM today.  He has had about 12 beers throughout the day, most recently right before the injury. ?  ? ? ?Physical Exam  ? ?Triage Vital Signs: ?ED Triage Vitals  ?Enc Vitals Group  ?   BP 03/14/22 1809 126/85  ?   Pulse Rate 03/14/22 1809 (!) 105  ?   Resp 03/14/22 1809 17  ?   Temp 03/14/22 1809 98.2 ?F (36.8 ?C)  ?   Temp Source 03/14/22 1809 Oral  ?   SpO2 03/14/22 1809 94 %  ?   Weight 03/14/22 1801 187 lb (84.8 kg)  ?   Height 03/14/22 1801 6' (1.829 m)  ?   Head Circumference --   ?   Peak Flow --   ?   Pain Score 03/14/22 1800 3  ?   Pain Loc --   ?   Pain Edu? --   ?   Excl. in GC? --   ? ? ?Most recent vital signs: ?Vitals:  ? 03/14/22 1809  ?BP: 126/85  ?Pulse: (!) 105  ?Resp: 17  ?Temp: 98.2 ?F (36.8 ?C)  ?SpO2: 94%  ? ? ? ?General: Awake, no distress.  ?CV:  Good peripheral perfusion.  Normal dorsalis pedis pulse, normal capillary refill in the toes ?Resp:  Normal effort.  Clear to auscultation bilaterally ?Abd:  No distention.  Soft and nontender ?Other:  Right leg with obvious deformity.  There is tenderness over the proximal fibula, as well as obvious fracture of the distal tibia.  Ankle joint appears intact.  There is no laceration, skin is intact. ? ? ?ED Results / Procedures / Treatments  ? ?Labs ?(all labs ordered are listed, but only abnormal results are displayed) ?Labs Reviewed - No data to  display ? ? ?EKG ? ? ? ? ?RADIOLOGY ?X-ray right tibia-fibula viewed and interpreted by me, shows displaced spiral morphology fracture of the distal tibia and proximal fibula.  X-ray of the right ankle shows intact joint.  Radiology reports reviewed. ? ? ? ?PROCEDURES: ? ?Critical Care performed: No ? ?.Ortho Injury Treatment ? ?Date/Time: 03/14/2022 7:10 PM ?Performed by: Sharman Cheek, MD ?Authorized by: Sharman Cheek, MD  ? ?Consent:  ?  Consent obtained:  Verbal ?  Consent given by:  Patient ?  Risks discussed:  Nerve damage, vascular damage, irreducible dislocation and fracture ?  Alternatives discussed:  Immobilization and delayed treatmentInjury location: lower leg ?Location details: right lower leg ?Injury type: fracture ?Fracture type: tibial and fibular shafts ?Pre-procedure neurovascular assessment: neurovascularly intact ?Pre-procedure distal perfusion: normal ?Pre-procedure neurological function: normal ?Pre-procedure range of motion: reduced ? ?Anesthesia: ?Local anesthesia used: no ? ?Patient sedated: NoManipulation performed: yes ?Skeletal traction used: yes ?Reduction successful: yes ?X-ray confirmed reduction: yes ?Immobilization: splint ?Splint type: long leg ?Splint Applied by: ED Nurse, ED Provider and ED  Tech ?Supplies used: cotton padding, elastic bandage and Ortho-Glass ?Post-procedure neurovascular assessment: post-procedure neurovascularly intact ?Post-procedure distal perfusion: normal ?Post-procedure neurological function: normal ?Post-procedure range of motion: improved ? ? ? ? ?MEDICATIONS ORDERED IN ED: ?Medications  ?HYDROmorphone (DILAUDID) injection 1 mg (1 mg Intravenous Given 03/14/22 1857)  ? ? ? ?IMPRESSION / MDM / ASSESSMENT AND PLAN / ED COURSE  ?I reviewed the triage vital signs and the nursing notes. ?             ?               ?Patient comes ED complaining of right lower leg pain after a twisting motion against heavy resistance.  Found to have fracture of his distal  tibia and proximal fibula.  This is a closed injury.  Discussed with orthopedics Dr. Hyacinth Meeker who will plan to admit the patient for surgery tomorrow, scheduled for OR at 8:00 AM.  Patient agrees with this plan. ? ?Patient given 1 mg IV Dilaudid for ongoing pain control.  Will gently reduce the injury and place in a long-leg splint, ice, elevate in anticipation of surgery. ? ? ?FINAL CLINICAL IMPRESSION(S) / ED DIAGNOSES  ? ?Final diagnoses:  ?Tibia/fibula fracture, shaft, right, closed, initial encounter  ? ? ? ?Rx / DC Orders  ? ?ED Discharge Orders   ? ? None  ? ?  ? ? ? ?Note:  This document was prepared using Dragon voice recognition software and may include unintentional dictation errors. ?  ?Sharman Cheek, MD ?03/14/22 1911 ? ?

## 2022-03-14 NOTE — ED Notes (Addendum)
Pt's RLE appears to be twisted. Pt is able to wiggle right toes. Pedal pulse is present. Pt denies taking any blood thinners.  ?

## 2022-03-14 NOTE — Plan of Care (Signed)

## 2022-03-14 NOTE — ED Triage Notes (Signed)
Pt coming from home via [facility]. Per EMS, pt was out using his hand truck in his yard and heard his right leg snap and crack.  ? ?EMS gave pt of fentanyl for pain.  ?

## 2022-03-15 ENCOUNTER — Inpatient Hospital Stay: Payer: 59 | Admitting: Anesthesiology

## 2022-03-15 ENCOUNTER — Encounter
Admission: EM | Disposition: A | Discharge status: Home / Self Care | Payer: Self-pay | Source: Home / Self Care | Attending: Specialist

## 2022-03-15 ENCOUNTER — Inpatient Hospital Stay: Payer: 59

## 2022-03-15 ENCOUNTER — Other Ambulatory Visit: Payer: Self-pay

## 2022-03-15 ENCOUNTER — Encounter: Payer: Self-pay | Admitting: Specialist

## 2022-03-15 HISTORY — PX: TIBIA IM NAIL INSERTION: SHX2516

## 2022-03-15 LAB — CBC
HCT: 39.2 % (ref 39.0–52.0)
Hemoglobin: 13.4 g/dL (ref 13.0–17.0)
MCH: 30.4 pg (ref 26.0–34.0)
MCHC: 34.2 g/dL (ref 30.0–36.0)
MCV: 88.9 fL (ref 80.0–100.0)
Platelets: 275 10*3/uL (ref 150–400)
RBC: 4.41 MIL/uL (ref 4.22–5.81)
RDW: 12.4 % (ref 11.5–15.5)
WBC: 6.7 10*3/uL (ref 4.0–10.5)
nRBC: 0 % (ref 0.0–0.2)

## 2022-03-15 LAB — RESP PANEL BY RT-PCR (FLU A&B, COVID) ARPGX2
Influenza A by PCR: NEGATIVE
Influenza B by PCR: NEGATIVE
SARS Coronavirus 2 by RT PCR: NEGATIVE

## 2022-03-15 LAB — URINE DRUG SCREEN, QUALITATIVE (ARMC ONLY)
Amphetamines, Ur Screen: NOT DETECTED
Barbiturates, Ur Screen: NOT DETECTED
Benzodiazepine, Ur Scrn: NOT DETECTED
Cannabinoid 50 Ng, Ur ~~LOC~~: NOT DETECTED
Cocaine Metabolite,Ur ~~LOC~~: NOT DETECTED
MDMA (Ecstasy)Ur Screen: NOT DETECTED
Methadone Scn, Ur: NOT DETECTED
Opiate, Ur Screen: NOT DETECTED
Phencyclidine (PCP) Ur S: NOT DETECTED
Tricyclic, Ur Screen: NOT DETECTED

## 2022-03-15 SURGERY — INSERTION, INTRAMEDULLARY ROD, TIBIA
Anesthesia: Spinal | Site: Leg Lower | Laterality: Right

## 2022-03-15 MED ORDER — MIDAZOLAM HCL 5 MG/5ML IJ SOLN
INTRAMUSCULAR | Status: DC | PRN
  Administered 2022-03-15: 2 mg via INTRAVENOUS

## 2022-03-15 MED ORDER — METHOCARBAMOL 500 MG PO TABS
500.0000 mg | ORAL_TABLET | Freq: Four times a day (QID) | ORAL | Status: DC | PRN
  Administered 2022-03-16: 500 mg via ORAL
  Filled 2022-03-15: qty 1

## 2022-03-15 MED ORDER — CEFAZOLIN SODIUM-DEXTROSE 2-4 GM/100ML-% IV SOLN
2.0000 g | Freq: Three times a day (TID) | INTRAVENOUS | Status: AC
  Administered 2022-03-15 – 2022-03-16 (×3): 2 g via INTRAVENOUS
  Filled 2022-03-15 (×3): qty 100

## 2022-03-15 MED ORDER — PROPOFOL 500 MG/50ML IV EMUL
INTRAVENOUS | Status: AC
  Filled 2022-03-15: qty 50

## 2022-03-15 MED ORDER — ONDANSETRON HCL 4 MG PO TABS: 4.0000 mg | ORAL_TABLET | Freq: Four times a day (QID) | ORAL | Status: DC | PRN

## 2022-03-15 MED ORDER — FLEET ENEMA 7-19 GM/118ML RE ENEM: 1.0000 | ENEMA | Freq: Once | RECTAL | Status: DC | PRN

## 2022-03-15 MED ORDER — CLINDAMYCIN PHOSPHATE 600 MG/50ML IV SOLN
600.0000 mg | Freq: Three times a day (TID) | INTRAVENOUS | Status: AC
  Administered 2022-03-15 – 2022-03-16 (×3): 600 mg via INTRAVENOUS
  Filled 2022-03-15 (×4): qty 50

## 2022-03-15 MED ORDER — METOCLOPRAMIDE HCL 5 MG/ML IJ SOLN: 5.0000 mg | Freq: Three times a day (TID) | INTRAMUSCULAR | Status: DC | PRN

## 2022-03-15 MED ORDER — MIDAZOLAM HCL 2 MG/2ML IJ SOLN
INTRAMUSCULAR | Status: AC
  Filled 2022-03-15: qty 2

## 2022-03-15 MED ORDER — LACTATED RINGERS IV SOLN
INTRAVENOUS | Status: DC | PRN
Start: 2022-03-15 — End: 2022-03-15

## 2022-03-15 MED ORDER — BISACODYL 10 MG RE SUPP: 10.0000 mg | Freq: Every day | RECTAL | Status: DC | PRN

## 2022-03-15 MED ORDER — ACETAMINOPHEN 10 MG/ML IV SOLN
INTRAVENOUS | Status: AC
  Filled 2022-03-15: qty 100

## 2022-03-15 MED ORDER — PROPOFOL 10 MG/ML IV BOLUS
INTRAVENOUS | Status: DC | PRN
  Administered 2022-03-15: 30 mg via INTRAVENOUS

## 2022-03-15 MED ORDER — FENTANYL CITRATE (PF) 100 MCG/2ML IJ SOLN: 25.0000 ug | INTRAMUSCULAR | Status: DC | PRN

## 2022-03-15 MED ORDER — 0.9 % SODIUM CHLORIDE (POUR BTL) OPTIME
TOPICAL | Status: DC | PRN
  Administered 2022-03-15: 1000 mL

## 2022-03-15 MED ORDER — FENTANYL CITRATE (PF) 100 MCG/2ML IJ SOLN
INTRAMUSCULAR | Status: AC
  Filled 2022-03-15: qty 2

## 2022-03-15 MED ORDER — ENOXAPARIN SODIUM 30 MG/0.3ML IJ SOSY
30.0000 mg | PREFILLED_SYRINGE | INTRAMUSCULAR | Status: DC
  Administered 2022-03-16: 30 mg via SUBCUTANEOUS
  Filled 2022-03-15: qty 0.3

## 2022-03-15 MED ORDER — BUPIVACAINE HCL (PF) 0.5 % IJ SOLN
INTRAMUSCULAR | Status: AC
  Filled 2022-03-15: qty 10

## 2022-03-15 MED ORDER — DOCUSATE SODIUM 100 MG PO CAPS
100.0000 mg | ORAL_CAPSULE | Freq: Two times a day (BID) | ORAL | Status: DC
  Administered 2022-03-16: 100 mg via ORAL
  Filled 2022-03-15 (×3): qty 1

## 2022-03-15 MED ORDER — LIDOCAINE HCL (PF) 2 % IJ SOLN
INTRAMUSCULAR | Status: AC
  Filled 2022-03-15: qty 5

## 2022-03-15 MED ORDER — BUPIVACAINE HCL (PF) 0.5 % IJ SOLN
INTRAMUSCULAR | Status: AC
  Filled 2022-03-15: qty 30

## 2022-03-15 MED ORDER — CEFAZOLIN SODIUM-DEXTROSE 2-4 GM/100ML-% IV SOLN
INTRAVENOUS | Status: AC
  Filled 2022-03-15: qty 100

## 2022-03-15 MED ORDER — PANTOPRAZOLE SODIUM 40 MG PO TBEC
40.0000 mg | DELAYED_RELEASE_TABLET | Freq: Every day | ORAL | Status: DC
  Administered 2022-03-15 – 2022-03-16 (×2): 40 mg via ORAL
  Filled 2022-03-15 (×2): qty 1

## 2022-03-15 MED ORDER — PROPOFOL 10 MG/ML IV BOLUS
INTRAVENOUS | Status: AC
  Filled 2022-03-15: qty 40

## 2022-03-15 MED ORDER — MORPHINE SULFATE (PF) 2 MG/ML IV SOLN
0.5000 mg | INTRAVENOUS | Status: DC | PRN
  Administered 2022-03-16 (×2): 0.5 mg via INTRAVENOUS
  Filled 2022-03-15 (×2): qty 1

## 2022-03-15 MED ORDER — ONDANSETRON HCL 4 MG/2ML IJ SOLN: 4.0000 mg | Freq: Four times a day (QID) | INTRAMUSCULAR | Status: DC | PRN

## 2022-03-15 MED ORDER — PROPOFOL 500 MG/50ML IV EMUL
INTRAVENOUS | Status: AC
  Filled 2022-03-15: qty 100

## 2022-03-15 MED ORDER — ONDANSETRON HCL 4 MG/2ML IJ SOLN: 4.0000 mg | Freq: Once | INTRAMUSCULAR | Status: DC | PRN

## 2022-03-15 MED ORDER — SODIUM CHLORIDE 0.45 % IV SOLN: INTRAVENOUS | Status: DC

## 2022-03-15 MED ORDER — ACETAMINOPHEN 10 MG/ML IV SOLN
INTRAVENOUS | Status: DC | PRN
Start: 2022-03-15 — End: 2022-03-15
  Administered 2022-03-15: 1000 mg via INTRAVENOUS

## 2022-03-15 MED ORDER — PHENYLEPHRINE 40 MCG/ML (10ML) SYRINGE FOR IV PUSH (FOR BLOOD PRESSURE SUPPORT)
PREFILLED_SYRINGE | INTRAVENOUS | Status: DC | PRN
Start: 2022-03-15 — End: 2022-03-15
  Administered 2022-03-15 (×2): 80 ug via INTRAVENOUS

## 2022-03-15 MED ORDER — METHOCARBAMOL 1000 MG/10ML IJ SOLN
500.0000 mg | Freq: Four times a day (QID) | INTRAVENOUS | Status: DC | PRN
  Filled 2022-03-15: qty 5

## 2022-03-15 MED ORDER — HYDROCODONE-ACETAMINOPHEN 5-325 MG PO TABS
1.0000 | ORAL_TABLET | ORAL | Status: DC | PRN
  Administered 2022-03-15 – 2022-03-16 (×5): 2 via ORAL
  Filled 2022-03-15 (×5): qty 2

## 2022-03-15 MED ORDER — MAGNESIUM HYDROXIDE 400 MG/5ML PO SUSP
30.0000 mL | Freq: Every day | ORAL | Status: DC | PRN
  Administered 2022-03-16: 30 mL via ORAL
  Filled 2022-03-15: qty 30

## 2022-03-15 MED ORDER — METOCLOPRAMIDE HCL 10 MG PO TABS: 5.0000 mg | ORAL_TABLET | Freq: Three times a day (TID) | ORAL | Status: DC | PRN

## 2022-03-15 MED ORDER — LIDOCAINE HCL (CARDIAC) PF 100 MG/5ML IV SOSY
PREFILLED_SYRINGE | INTRAVENOUS | Status: DC | PRN
  Administered 2022-03-15: 50 mg via INTRAVENOUS

## 2022-03-15 MED ORDER — ACETAMINOPHEN 325 MG PO TABS: 325.0000 mg | ORAL_TABLET | Freq: Four times a day (QID) | ORAL | Status: DC | PRN

## 2022-03-15 MED ORDER — BUPIVACAINE HCL 0.5 % IJ SOLN
INTRAMUSCULAR | Status: DC | PRN
  Administered 2022-03-15: 10 mL
  Administered 2022-03-15: 5 mL
  Administered 2022-03-15: 9 mL
  Administered 2022-03-15: 6 mL

## 2022-03-15 MED ORDER — BUPIVACAINE HCL (PF) 0.5 % IJ SOLN
INTRAMUSCULAR | Status: DC | PRN
  Administered 2022-03-15: 2.5 mL via INTRATHECAL

## 2022-03-15 MED ORDER — PROPOFOL 500 MG/50ML IV EMUL
INTRAVENOUS | Status: DC | PRN
  Administered 2022-03-15: 150 ug/kg/min via INTRAVENOUS

## 2022-03-15 MED ORDER — FENTANYL CITRATE (PF) 100 MCG/2ML IJ SOLN
INTRAMUSCULAR | Status: DC | PRN
  Administered 2022-03-15: 25 ug via INTRAVENOUS
  Administered 2022-03-15: 50 ug via INTRAVENOUS
  Administered 2022-03-15: 25 ug via INTRAVENOUS

## 2022-03-15 MED ORDER — CLINDAMYCIN PHOSPHATE 900 MG/50ML IV SOLN
INTRAVENOUS | Status: AC
  Filled 2022-03-15: qty 50

## 2022-03-15 SURGICAL SUPPLY — 50 items
BIT DRILL 3.8X6 NS (BIT) ×1 IMPLANT
BIT DRILL 4.4 (MISCELLANEOUS) ×1 IMPLANT
BIT DRILL 4.4 NS (BIT) ×1 IMPLANT
BNDG COHESIVE 4X5 TAN ST LF (GAUZE/BANDAGES/DRESSINGS) ×1 IMPLANT
CHLORAPREP W/TINT 26 (MISCELLANEOUS) ×3 IMPLANT
CUFF TOURN SGL QUICK 24 (TOURNIQUET CUFF) ×1
CUFF TOURN SGL QUICK 34 (TOURNIQUET CUFF)
CUFF TRNQT CYL 24X4X16.5-23 (TOURNIQUET CUFF) IMPLANT
CUFF TRNQT CYL 34X4.125X (TOURNIQUET CUFF) IMPLANT
DRAPE C-ARM XRAY 36X54 (DRAPES) ×2 IMPLANT
DRAPE C-ARMOR (DRAPES) ×1 IMPLANT
DRAPE U-SHAPE 47X51 STRL (DRAPES) ×2 IMPLANT
DRSG AQUACEL AG ADV 3.5X10 (GAUZE/BANDAGES/DRESSINGS) ×2 IMPLANT
ELECT REM PT RETURN 9FT ADLT (ELECTROSURGICAL) ×2
ELECTRODE REM PT RTRN 9FT ADLT (ELECTROSURGICAL) ×1 IMPLANT
GAUZE SPONGE 4X4 12PLY STRL (GAUZE/BANDAGES/DRESSINGS) ×2 IMPLANT
GAUZE XEROFORM 1X8 LF (GAUZE/BANDAGES/DRESSINGS) ×2 IMPLANT
GLOVE SURG ORTHO LTX SZ8.5 (GLOVE) ×2 IMPLANT
GLOVE SURG UNDER LTX SZ8 (GLOVE) ×2 IMPLANT
GLOVE SURG XRAY 8.0 LX (GLOVE) ×2 IMPLANT
GOWN STRL REUS W/ TWL LRG LVL3 (GOWN DISPOSABLE) ×1 IMPLANT
GOWN STRL REUS W/TWL LRG LVL3 (GOWN DISPOSABLE) ×1
GOWN STRL REUS W/TWL LRG LVL4 (GOWN DISPOSABLE) ×2 IMPLANT
GUIDEWIRE 3.0X100MM BALL TIP (WIRE) ×1 IMPLANT
GUIDEWIRE BALL NOSE 100CM (WIRE) ×2 IMPLANT
HEMOVAC 400CC 10FR (MISCELLANEOUS) ×2 IMPLANT
KIT TURNOVER KIT A (KITS) ×2 IMPLANT
MANIFOLD NEPTUNE II (INSTRUMENTS) ×2 IMPLANT
NAIL TIBIAL 11MM X 37.5CM (Nail) IMPLANT
NDL SPNL 18GX3.5 QUINCKE PK (NEEDLE) ×1 IMPLANT
NEEDLE SPNL 18GX3.5 QUINCKE PK (NEEDLE) ×2 IMPLANT
NS IRRIG 1000ML POUR BTL (IV SOLUTION) ×3 IMPLANT
PACK TOTAL KNEE (MISCELLANEOUS) ×2 IMPLANT
PAD ABD DERMACEA PRESS 5X9 (GAUZE/BANDAGES/DRESSINGS) ×2 IMPLANT
PADDING CAST 3IN STRL (MISCELLANEOUS) ×2
PADDING CAST BLEND 3X4 STRL (MISCELLANEOUS) IMPLANT
PIN GUIDE ACE (PIN) ×1 IMPLANT
SCREW ACECAP 32MM (Screw) ×1 IMPLANT
SCREW ACECAP 38MM (Screw) ×2 IMPLANT
SCREW PROXIMAL DEPUY (Screw) ×2 IMPLANT
SCREW PRXML FT 40X5.5XNS LF (Screw) IMPLANT
SCREW PRXML FT 55X5.5XNS TIB (Screw) IMPLANT
SCREWDRIVER HEX TIP 3.5MM (MISCELLANEOUS) ×1 IMPLANT
STAPLER SKIN PROX 35W (STAPLE) ×2 IMPLANT
SUT VIC AB 0 CT1 36 (SUTURE) ×4 IMPLANT
SUT VIC AB 2-0 CT1 27 (SUTURE) ×2
SUT VIC AB 2-0 CT1 TAPERPNT 27 (SUTURE) ×2 IMPLANT
SYR 10ML LL (SYRINGE) ×2 IMPLANT
TIBIAL NAIL 11MM X 37.5CM (Nail) ×2 IMPLANT
WATER STERILE IRR 1000ML POUR (IV SOLUTION) ×2 IMPLANT

## 2022-03-15 NOTE — Anesthesia Procedure Notes (Signed)
Spinal ? ?Patient location during procedure: OR ?Start time: 03/15/2022 8:39 AM ?End time: 03/15/2022 8:40 AM ?Reason for block: surgical anesthesia ?Staffing ?Performed: resident/CRNA  ?Anesthesiologist: Martha Clan, MD ?Resident/CRNA: Doreen Salvage, CRNA ?Preanesthetic Checklist ?Completed: patient identified, IV checked, site marked, risks and benefits discussed, surgical consent, monitors and equipment checked, pre-op evaluation and timeout performed ?Spinal Block ?Patient position: sitting ?Prep: Betadine ?Patient monitoring: heart rate, continuous pulse ox, blood pressure and cardiac monitor ?Approach: midline ?Location: L4-5 ?Injection technique: single-shot ?Needle ?Needle type: Whitacre and Introducer  ?Needle gauge: 24 G ?Needle length: 9 cm ?Assessment ?Events: CSF return ?Additional Notes ?Negative paresthesia. Negative blood return. Positive free-flowing CSF. Expiration date of kit checked and confirmed. Patient tolerated procedure well, without complications. ? ? ? ? ? ?

## 2022-03-15 NOTE — Progress Notes (Signed)
PT Cancellation Note ? ?Patient Details ?Name: Christian Jensen ?MRN: [id number] ?DOB: [birth year] ? ? ?Cancelled Treatment:    Reason Eval/Treat Not Completed: Other (comment) PT orders received, chart reviewed. Pt noted to still have impaired sensation in BLE. Also, per protocol, will see pt on POD #1 following traumatic injury.  ? ?Aleda Grana, PT, DPT ?03/15/22, 12:52 PM ? ? ? ?Sandi Mariscal ?03/15/2022, 12:51 PM ?

## 2022-03-15 NOTE — Op Note (Signed)
Expand All Collapse All   ?  ?03/15/2022 ? ?11:25 AM ? ?PATIENT:  Christian Jensen  ? ?MRN: [id number] ? ?PRE-OPERATIVE DIAGNOSIS: Displaced right tibia/fibula shaft fractures ? ?POST-OPERATIVE DIAGNOSIS: Same ? ?PROCEDURE:  Procedure(s): ?INTRAMEDULLARY (IM) NAIL TIBIAL right ? ?SURGEON:  Park Breed, MD ? ?ANESTHESIA:   Spinal  ? ?PREOPERATIVE INDICATIONS:  The patient  has a diagnosis of displaced and unstable tibia/ fibula fractures who elected for surgical management after discussion with the patient   about the options between surgery and cast management of the fractures. . ? ?The risks benefits and alternatives were discussed with the patient preoperatively including but not limited to the risks of infection, bleeding, nerve injury, cardiopulmonary complications, the need for revision surgery, among others, and the patient was willing to proceed. ? ?OPERATIVE IMPLANTS: Biomet Versanail,   11 mm ?   37.5 mm.  2 locking screws proximally and distally ? ?OPERATIVE FINDINGS: Displaced short oblique fracture right tibial shaft at the junction of the middle and distal thirds with displaced proximal fibular fracture ? ?COMPLICATIONS: None ? ?EBL: 50 cc      REPLACED: None ? ?OPERATIVE PROCEDURE: The patient was brought to the operating room and underwent spinal anesthesia without complications and then placed on the operating room table and positioned appropriately.  The operative leg was prepped and draped in a sterile fashion.  Tourniquet was not used.  IV anti-biotics were given.  The leg was placed on the tibial reduction triangle and the foot immobilized with Coban and traction and rotational corrections were made.  A proximal medial incision was made along the patellar tendon.  Dissection was carried out bluntly through subcutaneous tissue and the fascia was divided.  A guidepin was introduced into the proximal tibia under fluoroscopic control and seen to be in good alignment and position.  Large drill was  introduced to open the the proximal tibia.  A guidepin was then passed down the shaft of the tibia and across the fracture site down to the lower end of the tibia.  The shaft was then sequentially reamed to 12.5 mm.  The above listed Versanail was introduced and passed across the fracture site and fully seated in the tibia.  Fluoroscopy showed excellent position of the nail and that the fracture had been well reduced.  Length was excellent.  Proximally, fixation was obtained with 2 5.5 screw(s).   Fluoroscopy showed good position and length.  Distally, 1 AP and 1 medial lateral screw were introduced and seated fully. Fluoroscopy showed good position and length.  The wounds were then irrigated.  The knee fascia was closed with 2-0 Vicryls and the subcutaneous tissue was closed with 3-0 Vicryls.  All wounds were closed with staples.  Xeroform covered by dressing sponges and cast padding were applied. Sponge and needle counts were correct.   A well-padded posterior splint was applied.  The patient was transferred to the hospital bed and taken to recovery room in good condition. ? ?Park Breed, MD  ?   ?  ?   ?  ?

## 2022-03-15 NOTE — Transfer of Care (Signed)
Immediate Anesthesia Transfer of Care Note ? ?Patient: Christian Jensen ? ?Procedure(s) Performed: Procedure(s): ?INTRAMEDULLARY (IM) NAIL TIBIAL (Right) ? ?Patient Location: PACU ? ?Anesthesia Type:Spinal ? ?Level of Consciousness: sedated ? ?Airway & Oxygen Therapy: Patient Spontanous Breathing  ? ?Post-op Assessment: Report given to RN and Post -op Vital signs reviewed and stable ? ?Post vital signs: Reviewed and stable ? ?Last Vitals:  ?Vitals:  ? 03/15/22 0347 03/15/22 0732  ?BP: 132/90 (!) 140/99  ?Pulse: 89 92  ?Resp: 14 18  ?Temp: 36.9 ?C 36.9 ?C  ?SpO2: 98% 99%  ? ? ?Complications: No apparent anesthesia complications ?

## 2022-03-15 NOTE — Anesthesia Preprocedure Evaluation (Signed)
Anesthesia Evaluation  ?Patient identified by MRN, date of birth, ID band ?Patient awake ? ? ? ?Reviewed: ?Allergy & Precautions, H&P , NPO status , Patient's Chart, lab work & pertinent test results, reviewed documented beta blocker date and time  ? ?History of Anesthesia Complications ?Negative for: history of anesthetic complications ? ?Airway ?Mallampati: I ? ?TM Distance: >3 FB ?Neck ROM: full ? ? ? Dental ? ?(+) Dental Advidsory Given, Teeth Intact ?  ?Pulmonary ?neg pulmonary ROS, former smoker,  ?  ?Pulmonary exam normal ?breath sounds clear to auscultation ? ? ? ? ? ? Cardiovascular ?Exercise Tolerance: Good ?negative cardio ROS ?Normal cardiovascular exam ?Rhythm:regular Rate:Normal ? ? ?  ?Neuro/Psych ?negative neurological ROS ? negative psych ROS  ? GI/Hepatic ?Neg liver ROS, GERD  ,  ?Endo/Other  ?negative endocrine ROS ? Renal/GU ?negative Renal ROS  ?negative genitourinary ?  ?Musculoskeletal ? ? Abdominal ?  ?Peds ? Hematology ?negative hematology ROS ?(+)   ?Anesthesia Other Findings ?History reviewed. No pertinent past medical history. ? ? Reproductive/Obstetrics ?negative OB ROS ? ?  ? ? ? ? ? ? ? ? ? ? ? ? ? ?  ?  ? ? ? ? ? ? ? ? ?Anesthesia Physical ?Anesthesia Plan ? ?ASA: 2 ? ?Anesthesia Plan: Spinal  ? ?Post-op Pain Management:   ? ?Induction:  ? ?PONV Risk Score and Plan: 1 and Propofol infusion and TIVA ? ?Airway Management Planned: Natural Airway, Simple Face Mask and Nasal Cannula ? ?Additional Equipment:  ? ?Intra-op Plan:  ? ?Post-operative Plan:  ? ?Informed Consent: I have reviewed the patients History and Physical, chart, labs and discussed the procedure including the risks, benefits and alternatives for the proposed anesthesia with the patient or authorized representative who has indicated his/her understanding and acceptance.  ? ? ? ?Dental Advisory Given ? ?Plan Discussed with: Anesthesiologist, CRNA and Surgeon ? ?Anesthesia Plan Comments:    ? ? ? ? ? ? ?Anesthesia Quick Evaluation ? ?

## 2022-03-15 NOTE — Anesthesia Procedure Notes (Signed)
Date/Time: 03/15/2022 8:40 AM ?Performed by: Stormy Fabian, CRNA ?Pre-anesthesia Checklist: Patient identified, Emergency Drugs available, Suction available and Patient being monitored ?Patient Re-evaluated:Patient Re-evaluated prior to induction ?Oxygen Delivery Method: Simple face mask ?Induction Type: IV induction ?Dental Injury: Teeth and Oropharynx as per pre-operative assessment  ? ? ? ? ?

## 2022-03-15 NOTE — H&P (Signed)
PREOPERATIVE H&P ? ?Chief Complaint: Broken right tibia ? ?HPI: ?Christian Jensen is a 51 y.o. male who presents for preoperative history and physical with a diagnosis of Broken right tibia.  The patient was trying to move a heavy [place] with a hand truck in his yard yesterday afternoon He was brought to the emergency room where exam and x-rays reveal a displaced short oblique fracture of the right tibia at the junction of the middle and distal thirds.  Minimal comminution.  The fibula was also fractured.  Patient was splinted and admitted to the hospital for operative fixation of this fracture.  I discussed treatment with the patient.  Explained the difference between a nonoperative treatment with a cast and surgical treatment and risks and benefits of each.  The wishes to proceed with surgery.  We will proceed with this today.  Postop protocol was discussed with him as well.    ? ?History reviewed. No pertinent past medical history. ?Past Surgical History:  ?Procedure Laterality Date  ? TENDON REPAIR    ? bicep tendon  ? WISDOM TOOTH EXTRACTION    ? ?Social History  ? ?Socioeconomic History  ? Marital status: Divorced  ?  Spouse name: Not on file  ? Number of children: Not on file  ? Years of education: Not on file  ? Highest education level: Not on file  ?Occupational History  ? Not on file  ?Tobacco Use  ? Smoking status: Former  ?  Packs/day: 0.05  ?  Types: Cigarettes  ?  Quit date: 10/23/2015  ?  Years since quitting: 6.3  ? Smokeless tobacco: Never  ?Vaping Use  ? Vaping Use: Never used  ?Substance and Sexual Activity  ? Alcohol use: Yes  ?  Comment: weekends  ? Drug use: Yes  ?  Types: Marijuana  ?  Comment: yesterday last used  ? Sexual activity: Not on file  ?Other Topics Concern  ? Not on file  ?Social History Narrative  ? Not on file  ? ?Social Determinants of Health  ? ?Financial Resource Strain: Not on file  ?Food Insecurity: Not on file  ?Transportation Needs: Not on file  ?Physical Activity: Not on  file  ?Stress: Not on file  ?Social Connections: Not on file  ? ?Family History  ?Problem Relation Age of Onset  ? Lung disease Mother   ? Hypertension Father   ? Depression Father   ? Heart disease Maternal Grandfather   ? Stroke Paternal Grandmother   ? ?Allergies  ?Allergen Reactions  ? Bacitracin Rash and Swelling  ? ?Prior to Admission medications   ?Medication Sig Start Date End Date Taking? Authorizing Provider  ?omeprazole (PRILOSEC) 20 MG capsule Take 1 capsule (20 mg total) by mouth daily. ?Patient not taking: Reported on 03/14/2022 06/14/20   Jerrol Banana., MD  ?predniSONE (DELTASONE) 20 MG tablet Take 1 tablet (20 mg total) by mouth daily with breakfast. ?Patient not taking: Reported on 03/14/2022 09/03/21   Jerrol Banana., MD  ? ? ? ?Positive ROS: All other systems have been reviewed and were otherwise negative with the exception of those mentioned in the HPI and as above. ? ?Physical Exam: ?General: Alert, no acute distress ?Cardiovascular: No pedal edema. Heart is regular and without murmur.  ?Respiratory: No cyanosis, no use of accessory musculature. Lungs are clear. ?GI: No organomegaly, abdomen is soft and non-tender ?Skin: No lesions in the area of chief complaint ?Neurologic: Sensation intact distally ?Psychiatric: Patient is competent  for consent with normal mood and affect ?Lymphatic: No axillary or cervical lymphadenopathy ? ?MUSCULOSKELETAL: Right leg is splinted.  The neurovascular status is good distally.  There is no bleeding.  Skin is intact per the emergency room physicians.  No other injuries are noted. ? ?Assessment: ?Broken right tibia ? ?Plan: ?Plan for Procedure(s): ?INTRAMEDULLARY (IM) NAIL TIBIAL ? ?The risks benefits and alternatives were discussed with the patient including but not limited to the risks of nonoperative treatment, versus surgical intervention including infection, bleeding, nerve injury,  blood clots, cardiopulmonary complications, morbidity,  mortality, among others, and they were willing to proceed.  ? ?Park Breed, MD ?[phone] ? ? ?03/15/2022 ?8:20 AM  ?

## 2022-03-15 NOTE — H&P (Signed)
THE PATIENT WAS SEEN PRIOR TO SURGERY TODAY.  HISTORY, ALLERGIES, HOME MEDICATIONS AND OPERATIVE PROCEDURE WERE REVIEWED. RISKS AND BENEFITS OF SURGERY DISCUSSED WITH PATIENT AGAIN.  NO CHANGES FROM INITIAL HISTORY AND PHYSICAL NOTED.    

## 2022-03-16 LAB — BASIC METABOLIC PANEL
Anion gap: 7 (ref 5–15)
BUN: 11 mg/dL (ref 6–20)
CO2: 27 mmol/L (ref 22–32)
Calcium: 8.4 mg/dL — ABNORMAL LOW (ref 8.9–10.3)
Chloride: 105 mmol/L (ref 98–111)
Creatinine, Ser: 0.81 mg/dL (ref 0.61–1.24)
GFR, Estimated: >60 mL/min (ref >60)
Glucose, Bld: 114 mg/dL — ABNORMAL HIGH (ref 70–99)
Potassium: 4 mmol/L (ref 3.5–5.1)
Sodium: 139 mmol/L (ref 135–145)

## 2022-03-16 LAB — GLUCOSE, CAPILLARY: Glucose-Capillary: 129 mg/dL — ABNORMAL HIGH (ref 70–99)

## 2022-03-16 MED ORDER — GABAPENTIN 400 MG PO CAPS: 400.0000 mg | ORAL_CAPSULE | Freq: Three times a day (TID) | ORAL | 3 refills | Status: AC

## 2022-03-16 MED ORDER — HYDROCODONE-ACETAMINOPHEN 5-325 MG PO TABS: 1.0000 | ORAL_TABLET | Freq: Four times a day (QID) | ORAL | 0 refills | Status: AC | PRN

## 2022-03-16 MED ORDER — ASPIRIN EC 81 MG PO TBEC: 81.0000 mg | DELAYED_RELEASE_TABLET | Freq: Three times a day (TID) | ORAL | 2 refills | Status: AC

## 2022-03-16 MED ORDER — MELOXICAM 15 MG PO TABS: 15.0000 mg | ORAL_TABLET | Freq: Every day | ORAL | 3 refills | Status: AC

## 2022-03-16 MED ORDER — METHOCARBAMOL 500 MG PO TABS
500.0000 mg | ORAL_TABLET | Freq: Three times a day (TID) | ORAL | 2 refills | Status: AC | PRN
Start: 2022-03-16 — End: ?

## 2022-03-16 NOTE — Plan of Care (Signed)
?  Problem: Health Behavior/Discharge Planning: ?Goal: Ability to manage health-related needs will improve ?Outcome: Progressing ?  ?Problem: Clinical Measurements: ?Goal: Will remain free from infection ?Outcome: Progressing ?Goal: Diagnostic test results will improve ?Outcome: Progressing ?  ?Problem: Activity: ?Goal: Risk for activity intolerance will decrease ?Outcome: Progressing ?  ?Problem: Pain Managment: ?Goal: General experience of comfort will improve ?Outcome: Progressing ?  ?Problem: Education: ?Goal: Knowledge of General Education information will improve ?Description: Including pain rating scale, medication(s)/side effects and non-pharmacologic comfort measures ?Outcome: Completed/Met ?  ?Problem: Clinical Measurements: ?Goal: Ability to maintain clinical measurements within normal limits will improve ?Outcome: Completed/Met ?Goal: Respiratory complications will improve ?Outcome: Completed/Met ?Goal: Cardiovascular complication will be avoided ?Outcome: Completed/Met ?  ?Problem: Nutrition: ?Goal: Adequate nutrition will be maintained ?Outcome: Completed/Met ?  ?Problem: Coping: ?Goal: Level of anxiety will decrease ?Outcome: Completed/Met ?  ?Problem: Elimination: ?Goal: Will not experience complications related to bowel motility ?Outcome: Completed/Met ?Goal: Will not experience complications related to urinary retention ?Outcome: Completed/Met ?  ?Problem: Safety: ?Goal: Ability to remain free from injury will improve ?Outcome: Completed/Met ?  ?Problem: Skin Integrity: ?Goal: Risk for impaired skin integrity will decrease ?Outcome: Completed/Met ?  ?  ?

## 2022-03-16 NOTE — Evaluation (Signed)
Physical Therapy Evaluation ?Patient Details ?Name: Christian RaymondJason M Jensen ?MRN: [id number] ?DOB: 05/28/1971 ?Today's Date: 03/16/2022 ? ?History of Present Illness ? Pt is a 51 y/o M who presented to the ED on 03/14/22 with c/c of sudden onset RLE pain while trying to use a hand truck to pull a large [place]oulder in his yard. Pt found to have fracture of his distal tibia and proximal fibula. Pt underwent surgery for IM nail of R tibia on 03/15/24 by Dr. Hyacinth MeekerMiller. Pt is NWB RLE. ?  ?Clinical Impression ? Pt seen for PT evaluation with pt agreeable. Pt reports prior to admission he was active, working, and independent. Pt presents with appropriate safety questions during session with PT providing education. Pt is able to complete bed mobility with mod I and STS with supervision with cuing for safe hand placement. Pt ambulates room<>gym with RW & supervision with good ability to maintain NWB RLE. Spent significant time discussing stair negotiation via various methods with pt demonstrating good awareness of technique for boosting up steps on buttocks & pt in agreement of using this method. Pt primarily concerned about RLE pain at this time but was premedicated for session.  Notified team that from PT standpoint pt is safe to d/c home with RW.  ? ?Recommendations for follow up therapy are one component of a multi-disciplinary discharge planning process, led by the attending physician.  Recommendations may be updated based on patient status, additional functional criteria and insurance authorization. ? ?Follow Up Recommendations  (anticipate pt will require OPPT f/u once able to weight bear on RLE) ? ?  ?Assistance Recommended at Discharge PRN  ?Patient can return home with the following ? A little help with walking and/or transfers;Assistance with cooking/housework;Assist for transportation;Help with stairs or ramp for entrance ? ?  ?Equipment Recommendations Rolling walker (2 wheels)  ?Recommendations for Other Services ?    ?  ?Functional  Status Assessment Patient has had a recent decline in their functional status and demonstrates the ability to make significant improvements in function in a reasonable and predictable amount of time.  ? ?  ?Precautions / Restrictions Precautions ?Precautions: Fall ?Restrictions ?Weight Bearing Restrictions: Yes ?RLE Weight Bearing: Non weight bearing  ? ?  ? ?Mobility ? Bed Mobility ?Overal bed mobility: Modified Independent ?  ?  ?  ?  ?  ?  ?General bed mobility comments: supine<>sit with HOB elevated, uses UE to assist RLE on/off of bed ?  ? ?Transfers ?Overall transfer level: Needs assistance ?Equipment used: Rolling walker (2 wheels) ?Transfers: Sit to/from Stand ?Sit to Stand: Supervision ?  ?  ?  ?  ?  ?General transfer comment: educational cuing for hand placement but no physical assistance & good recall of hand placement throughout session ?  ? ?Ambulation/Gait ?Ambulation/Gait assistance: Supervision ?Gait Distance (Feet): 60 Feet (+ 60 ft) ?Assistive device: Rolling walker (2 wheels) ?Gait Pattern/deviations: Step-to pattern, Decreased step length - left ?Gait velocity: decreased ?  ?  ?General Gait Details: Pt able to maintain NWB RLE throughout gait, good use of BUE & LLE ? ?Stairs ?Stairs: Yes ?Stairs assistance: Supervision ?Stair Management: No rails, Backwards, Seated/boosting ?Number of Stairs: 4 ?General stair comments: Discussed various methods of negotiating stairs into home with PT providing demo for hopping up laterally with BUE on rail & boosting up on bottom with pt agreeing boosting would be most safe. Pt is able to transfer sit<>stand on steps with use of rails and supervision and bump up steps without assistance. PT  educated pt on negotiating bottom step when descending stairs with BUE support on RW, need to place chair at top of steps to transition from steps>chair>standing, and need for wife to move RW up/down steps for pt's use. Also discussed parking beside steps to pt can stand from  car & pivot to steps, as well as need to use lower car vs elevated truck with running boards/steps to negotiate. Pt reports good understanding of technique of negotiating stairs by boosting on bottom, as he reports he's helped his wife in this manner in the past. ? ?Wheelchair Mobility ?  ? ?Modified Rankin (Stroke Patients Only) ?  ? ?  ? ?Balance Overall balance assessment: Needs assistance ?Sitting-balance support: Feet supported ?Sitting balance-Leahy Scale: Normal ?  ?  ?Standing balance support: During functional activity, Reliant on assistive device for balance, No upper extremity supported, Bilateral upper extremity supported ?Standing balance-Leahy Scale: Fair ?Standing balance comment: BUE support on RW with supervision; pt requires min assist for static standing on LLE without BUE support while donning mask ?  ?  ?  ?  ?  ?  ?  ?  ?  ?  ?  ?   ? ? ? ?Pertinent Vitals/Pain Pain Assessment ?Pain Assessment: Faces ?Faces Pain Scale: Hurts even more ?Pain Location: anterior aspect of R knee, entire lower RLE with dependent positioning ?Pain Descriptors / Indicators: Discomfort, Grimacing ?Pain Intervention(s): Monitored during session, Premedicated before session  ? ? ?Home Living Family/patient expects to be discharged to:: Private residence ?Living Arrangements: Spouse/significant other ?Available Help at Discharge: Family;Available PRN/intermittently ?Type of Home: House ?Home Access: Stairs to enter ?Entrance Stairs-Rails: Right;Left ?Entrance Stairs-Number of Steps: 5+landing+5 to enter front door, 7 steps to enter back door ?  ?Home Layout: One level ?Home Equipment: None ?   ?  ?Prior Function Prior Level of Function : Independent/Modified Independent;Driving ?  ?  ?  ?  ?  ?  ?Mobility Comments: Independent, working in [employer], driving ?  ?  ? ? ?Hand Dominance  ?   ? ?  ?Extremity/Trunk Assessment  ? Upper Extremity Assessment ?Upper Extremity Assessment: Overall WFL for tasks assessed ?  ? ?Lower  Extremity Assessment ?Lower Extremity Assessment: RLE deficits/detail ?RLE Deficits / Details: RLE with weakness but not able to formally test post operatively & with splint on; pt does report some tingling in toes but is able to wiggle toes ?  ? ?Cervical / Trunk Assessment ?Cervical / Trunk Assessment: Normal  ?Communication  ? Communication: No difficulties  ?Cognition Arousal/Alertness: Awake/alert ?Behavior During Therapy: [facility] for tasks assessed/performed ?Overall Cognitive Status: Within Functional Limits for tasks assessed ?  ?  ?  ?  ?  ?  ?  ?  ?  ?  ?  ?  ?  ?  ?  ?  ?  ?  ?  ? ?  ?General Comments   ? ?  ?Exercises General Exercises - Lower Extremity ?Hip ABduction/ADduction: AAROM, Strengthening, Right, 10 reps (long sitting in recliner) ?Straight Leg Raises: AAROM, Strengthening, Right, 10 reps (long sitting in recliner) ?Hip Flexion/Marching:  (pt attempts RLE hip flexion in standing with BUE support on RW but pt endorses increased pain in anterior aspect of R knee so exercise deferred upon PT instruction)  ? ?Assessment/Plan  ?  ?PT Assessment Patient needs continued PT services  ?PT Problem List Decreased strength;Decreased mobility;Decreased range of motion;Decreased activity tolerance;Decreased balance;Pain;Decreased knowledge of use of DME ? ?   ?  ?PT Treatment  Interventions DME instruction;Therapeutic exercise;Gait training;Balance training;Stair training;Neuromuscular re-education;Functional mobility training;Therapeutic activities;Patient/family education;Modalities   ? ?PT Goals (Current goals can be found in the Care Plan section)  ?Acute Rehab PT Goals ?Patient Stated Goal: get better, decreased pain ?PT Goal Formulation: With patient ?Time For Goal Achievement: 03/30/22 ?Potential to Achieve Goals: Good ? ?  ?Frequency BID ?  ? ? ?Co-evaluation   ?  ?  ?  ?  ? ? ?  ?AM-PAC PT "6 Clicks" Mobility  ?Outcome Measure Help needed turning from your back to your side while in a flat bed without  using bedrails?: None ?Help needed moving from lying on your back to sitting on the side of a flat bed without using bedrails?: None ?Help needed moving to and from a bed to a chair (including a wheelcha

## 2022-03-16 NOTE — Discharge Summary (Signed)
Physician Discharge Summary  ?Patient ID: ?Christian Jensen ?MRN: [id number] ?DOB/AGE: 1971-05-14 50 y.o. ? ?Admit date: 03/14/2022 ?Discharge date: 03/16/2022 ? ?Admission Diagnoses: ? ?Discharge Diagnoses:  ?Principal Problem: ?  Closed displaced spiral fracture of shaft of right tibia ? ? ?Discharged Condition: good ? ?Hospital Course: The patient was admitted Friday, 03/14/2022 with a displaced shaft fracture of the right tibia and upper fibula.  He was stabilized overnight and on 03/15/2022 underwent intramedullary rodding of the right tibia without complication.  Postreduction x-rays showed anatomic alignment of the fractures.  He did well overnight and did well with physical therapy this morning.  He is stable and ready for discharge home today.  We will follow him up in the office in 3 to 4 days. ? ?Consults: None ? ?Significant Diagnostic Studies: X-rays of the right tibia showed a displaced short oblique fracture of the right tibial shaft with at the junction of the middle and distal thirds.  Proximal fibula was also fractured. ? ?Treatments: surgery: Open reduction internal fixation right tibia with intramedullary rod ? ?Discharge Exam: ?Blood pressure (!) 137/91, pulse 79, temperature 98.2 ?F (36.8 ?C), temperature source Oral, resp. rate 18, height 6' (1.829 m), weight 84.8 kg, SpO2 100 %. ?Dressings are dry.  Neurovascular status is good distally.  He is able to move the toes well.  Knee movement is good. ? ?Disposition: Discharge disposition: 01-Home or Self Care ? ? ? ? ? ? ?Discharge Instructions   ? ? Call MD for:   Complete by: As directed ?  ? Call MD for:  persistant nausea and vomiting   Complete by: As directed ?  ? Call MD for:  redness, tenderness, or signs of infection (pain, swelling, redness, odor or green/yellow discharge around incision site)   Complete by: As directed ?  ? Call MD for:  severe uncontrolled pain   Complete by: As directed ?  ? Call MD for:  temperature >100.4   Complete by: As  directed ?  ? Diet - low sodium heart healthy   Complete by: As directed ?  ? Increase activity slowly   Complete by: As directed ?  ? No wound care   Complete by: As directed ?  ? ?  ? ? ? ? Follow-up Information   ? ? Deeann Saint, MD Follow up.   ?Specialty: Orthopedic Surgery ?Why: For wound re-check ?Contact information: ?[address] ?[place] [address] ?[phone] ? ? ?  ?  ? ?  ?  ? ?  ? ? ?Signed: ?Valinda Hoar ?03/16/2022, 11:57 AM ? ? ?

## 2022-03-16 NOTE — Progress Notes (Signed)
Blood pressure (!) 137/91, pulse 79, temperature 98.2 ?F (36.8 ?C), temperature source Oral, resp. rate 18, height 6' (1.829 m), weight 84.8 kg, SpO2 100 %. ?IV removed site c/d/I. Discussed d/c packet with pt and wife at bedside both understood information provided, scripts sent to RX for pickup  pt d/c with all belongings down to private car with DME.  ?

## 2022-03-16 NOTE — TOC Progression Note (Signed)
Transition of Care (TOC) - Progression Note  ? ? ?Patient Details  ?Name: Christian Jensen ?MRN: [id number] ?Date of Birth: May 24, 1971 ? ?Transition of Care (TOC) CM/SW Contact  ?Bing Quarry, RN ?Phone Number: ?03/16/2022, 10:21 AM ? ?Clinical Narrative:  DME RW ordered via [employer] to be delivered today in anticipation of discharge later today or tomorrow s/p surgery for displaced right tibia/fibula shaft fractures on 03/15/22. DME order in. Gabriel Cirri RN CM  ? ? ? ?  ?  ? ?Expected Discharge Plan and Services ?  ?  ?  ?  ?  ?                ?DME Arranged: Walker rolling ?DME Agency: [employer] ?Date DME Agency Contacted: 03/16/22 ?Time DME Agency Contacted: [phone] ?Representative spoke with at DME Agency: Leavy Cella ?HH Arranged: NA ?HH Agency: NA ?  ?  ?  ? ? ?Social Determinants of Health (SDOH) Interventions ?  ? ?Readmission Risk Interventions ?   ? View : No data to display.  ?  ?  ?  ? ? ?

## 2022-03-16 NOTE — Progress Notes (Signed)
Subjective: ?1 Day Post-Op Procedure(s) (LRB): ?INTRAMEDULLARY (IM) NAIL TIBIAL (Right) ?  ?Patient is out of bed in a chair.  He is alert and oriented.  He did well with physical therapy who feels he can go home.  Pain is moderate.  Dressings are dry. ? ?Patient reports pain as moderate. ? ?Objective:  ? ?VITALS:   ?Vitals:  ? 03/16/22 0421 03/16/22 0842  ?BP: (!) 132/94 (!) 137/91  ?Pulse: 77 79  ?Resp: 18 18  ?Temp: 97.8 ?F (36.6 ?C) 98.2 ?F (36.8 ?C)  ?SpO2: 99% 100%  ? ? ?Incision: dressing C/D/I ? ?LABS ?Recent Labs  ?  03/14/22 ?2252 03/15/22 ?1305  ?HGB 13.7 13.4  ?HCT 39.4 39.2  ?WBC 7.5 6.7  ?PLT 283 275  ? ? ?Recent Labs  ?  03/14/22 ?2252 03/16/22 ?0416  ?NA 138 139  ?K 3.9 4.0  ?BUN 14 11  ?CREATININE 0.88 0.81  ?GLUCOSE 95 114*  ? ? ?Recent Labs  ?  03/14/22 ?2252  ?INR 1.1  ? ? ? ?Assessment/Plan: ?1 Day Post-Op Procedure(s) (LRB): ?INTRAMEDULLARY (IM) NAIL TIBIAL (Right) ? ? ?Advance diet ?Up with therapy ?Discharge home today.  We will repeat ?We will return to office in 3 to 4 days. ?Discharge on aspirin, hydrocodone, meloxicam, and gabapentin. ? ?

## 2022-03-17 ENCOUNTER — Encounter: Payer: Self-pay | Admitting: Specialist

## 2022-03-17 NOTE — Anesthesia Postprocedure Evaluation (Signed)
Anesthesia Post Note ? ?Patient: Christian Jensen ? ?Procedure(s) Performed: INTRAMEDULLARY (IM) NAIL TIBIAL (Right: Leg Lower) ? ?Patient location during evaluation: Other ?Anesthesia Type: Spinal ?Anesthetic complications: no ?Comments: Patient discharged before post op round ? ? ?No notable events documented. ? ? ?Last Vitals:  ?Vitals:  ? 03/16/22 0421 03/16/22 0842  ?BP: (!) 132/94 (!) 137/91  ?Pulse: 77 79  ?Resp: 18 18  ?Temp: 36.6 ?C 36.8 ?C  ?SpO2: 99% 100%  ?  ?Last Pain:  ?Vitals:  ? 03/16/22 1033  ?TempSrc:   ?PainSc: 6   ? ? ?  ?  ?  ?  ?  ?  ? ?Diyan Dave,  Loraine Leriche R ? ? ? ? ?

## 2022-08-28 IMAGING — DX DG TIBIA/FIBULA PORT 2V*R*
4 series · 4 of 4 positions shown · non-contrast
Comparison: 03/14/2022

CLINICAL DATA: ORIF tibial fracture.

EXAM:
PORTABLE RIGHT TIBIA AND FIBULA - 2 VIEW

[tibia ap (1 of 2)]
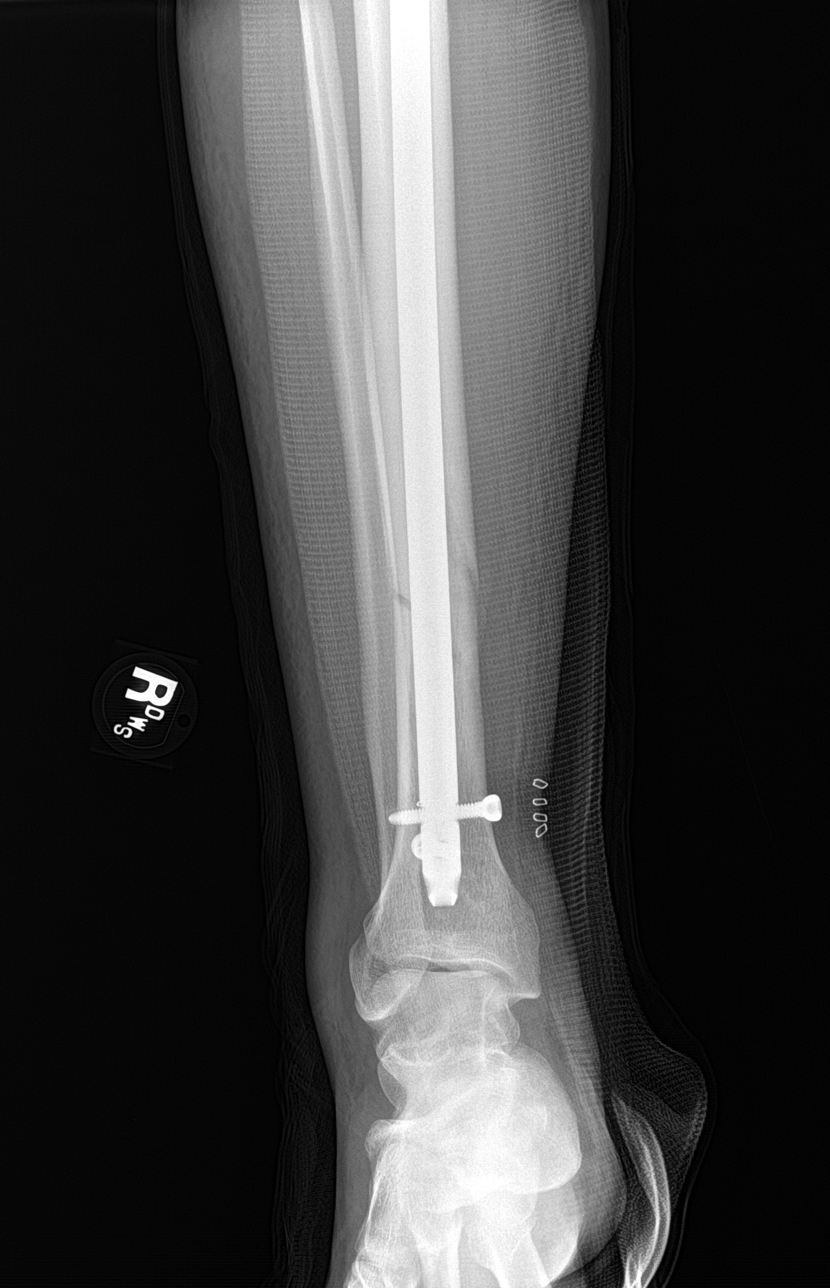

[tibia lat (1 of 2)]
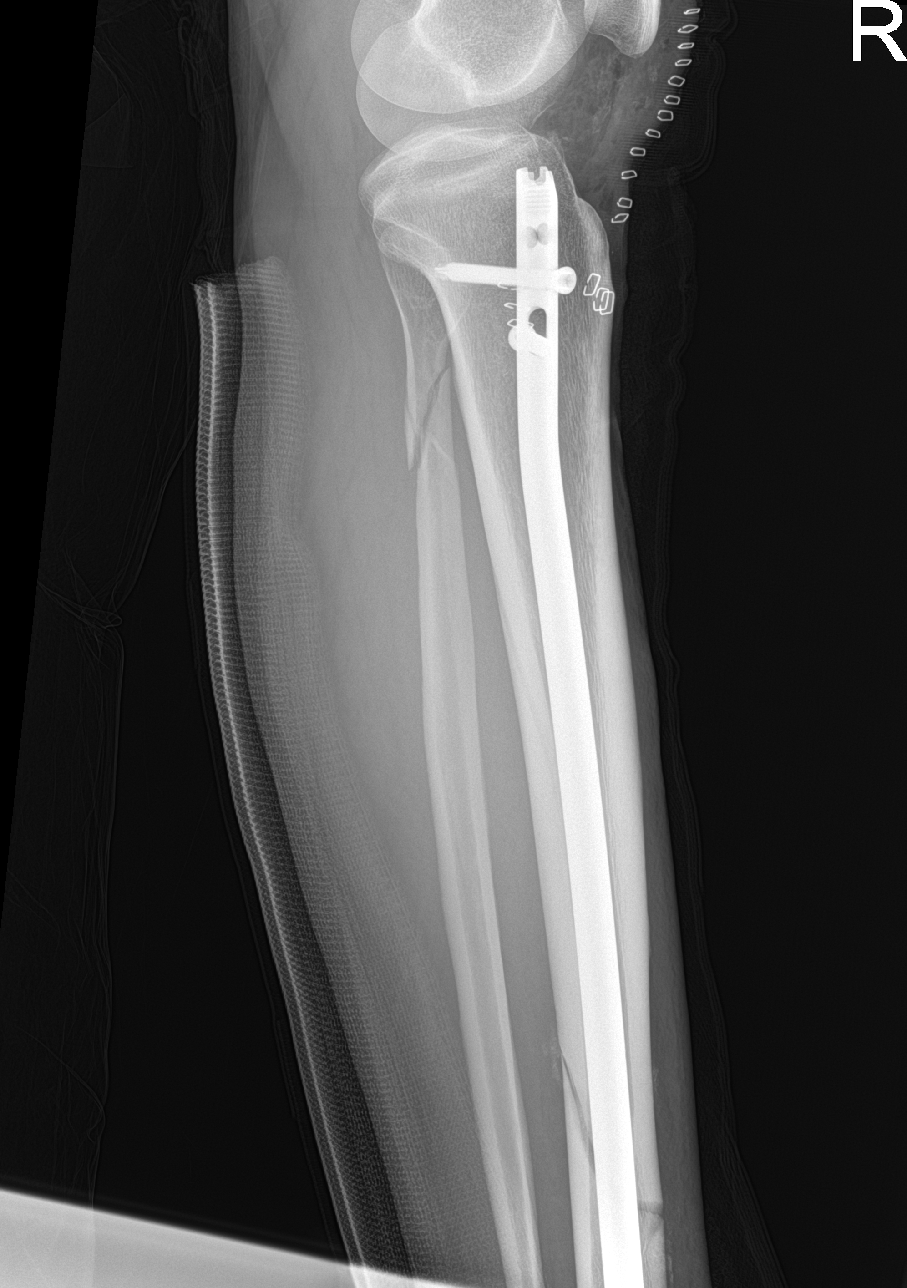

[tibia lat (2 of 2)]
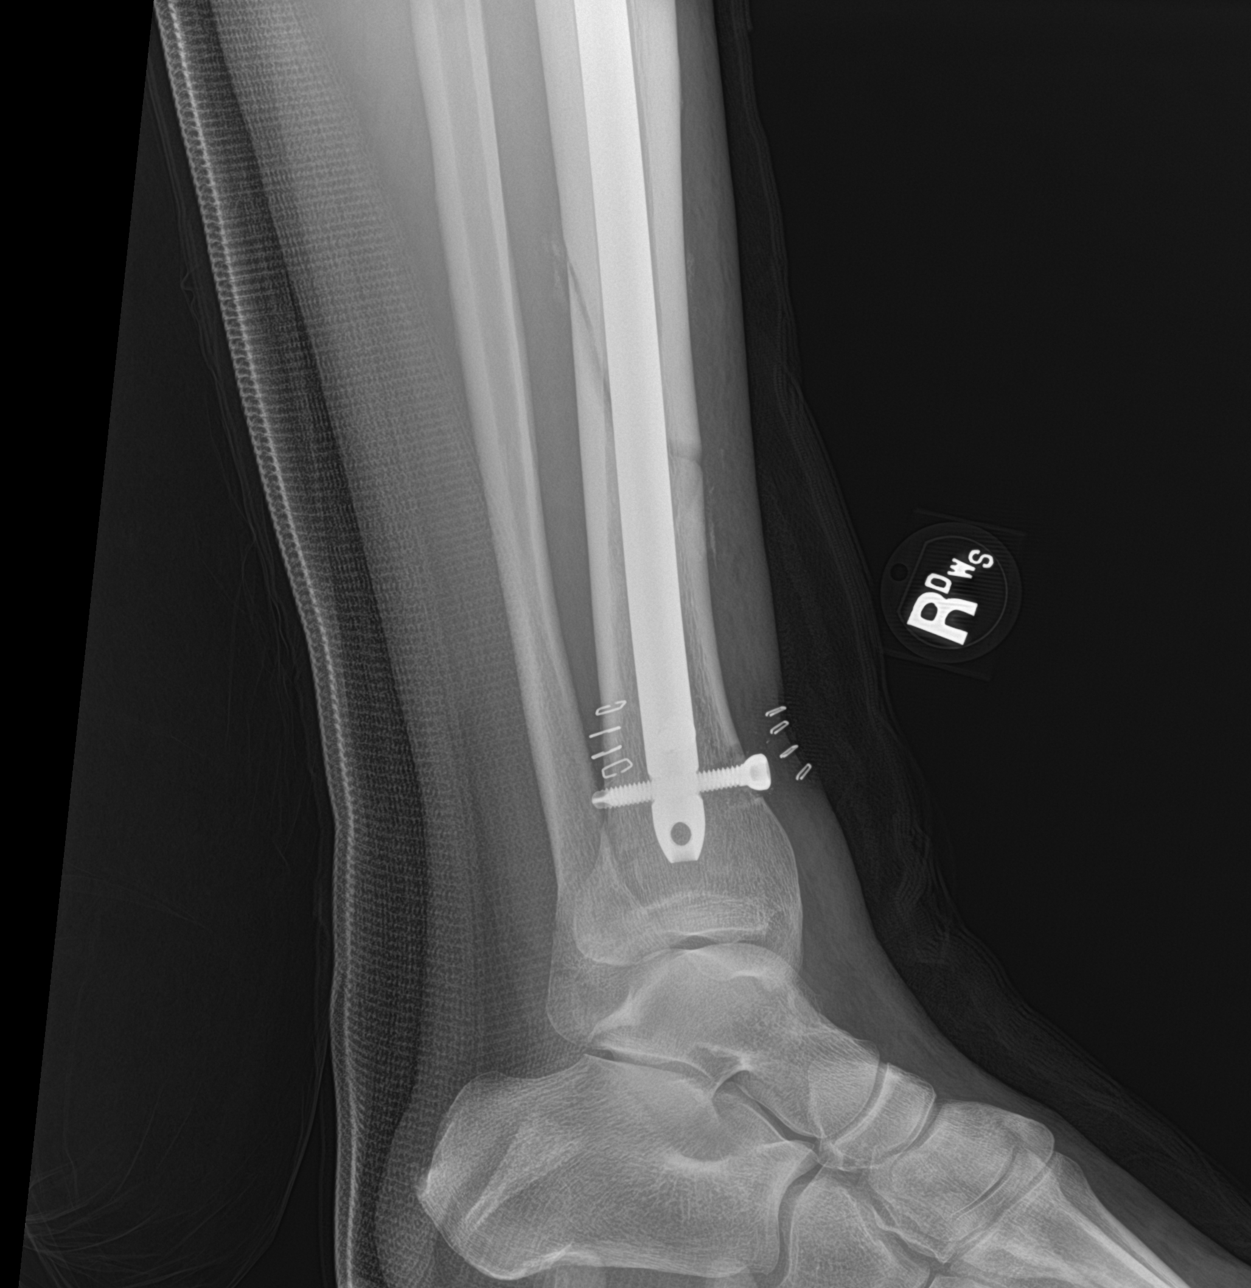

[tibia ap (2 of 2)]
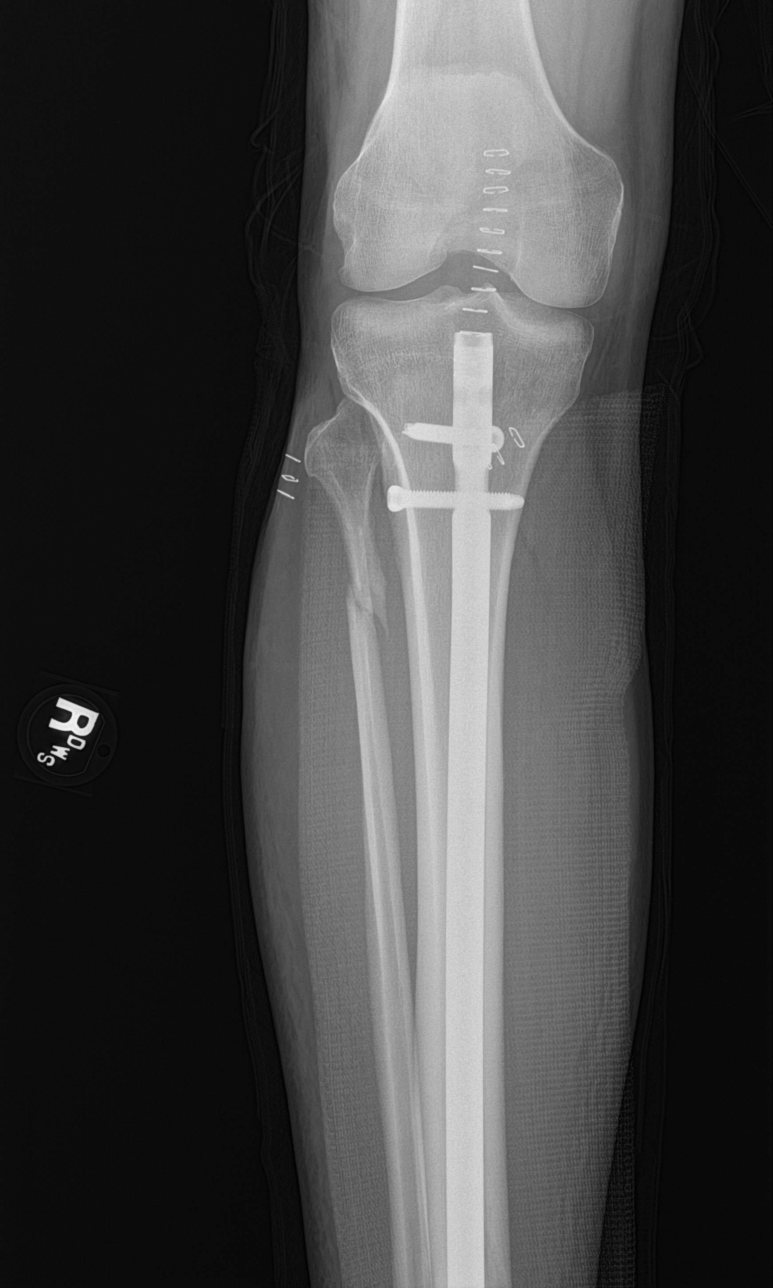

[4 of 4 positions shown; findings below may reference images not displayed]

FINDINGS: Post intramedullary rod fixation of known obliquely oriented distal
diaphyseal tibial fracture. Alignment appears anatomic. The distal
end of the intramedullary rod is transfixed with 2 perpendicularly
oriented cancellous screws. The proximal end of the tibial rod is
not imaged. Adjacent skin staples and subcutaneous emphysema.
Overlying casting material. No radiopaque foreign body.

Improved alignment of known proximal fibular fracture without
dedicated ORIF.
IMPRESSION: 1. Post intramedullary rod fixation of distal diaphyseal tibial
fracture without evidence of complication.
2. Improved alignment of known proximal fibular fracture without
dedicated ORIF.

## 2022-08-28 IMAGING — RF DG TIBIA/FIBULA 2V*R*
1 series · 2 of 2 positions shown · non-contrast
Comparison: Right tibia and fibular radiographs-03/14/2022

Fluoroscopy time: 1 minute, 42 seconds

CLINICAL DATA: ORIF distal tibial fracture.

EXAM:
RIGHT TIBIA AND FIBULA - 2 VIEW; DG C-ARM 1-60 MIN-NO REPORT

[Series 1: dg x-ray · 0.20mm/px · 2 of 2 slices shown]
[im 1/2]
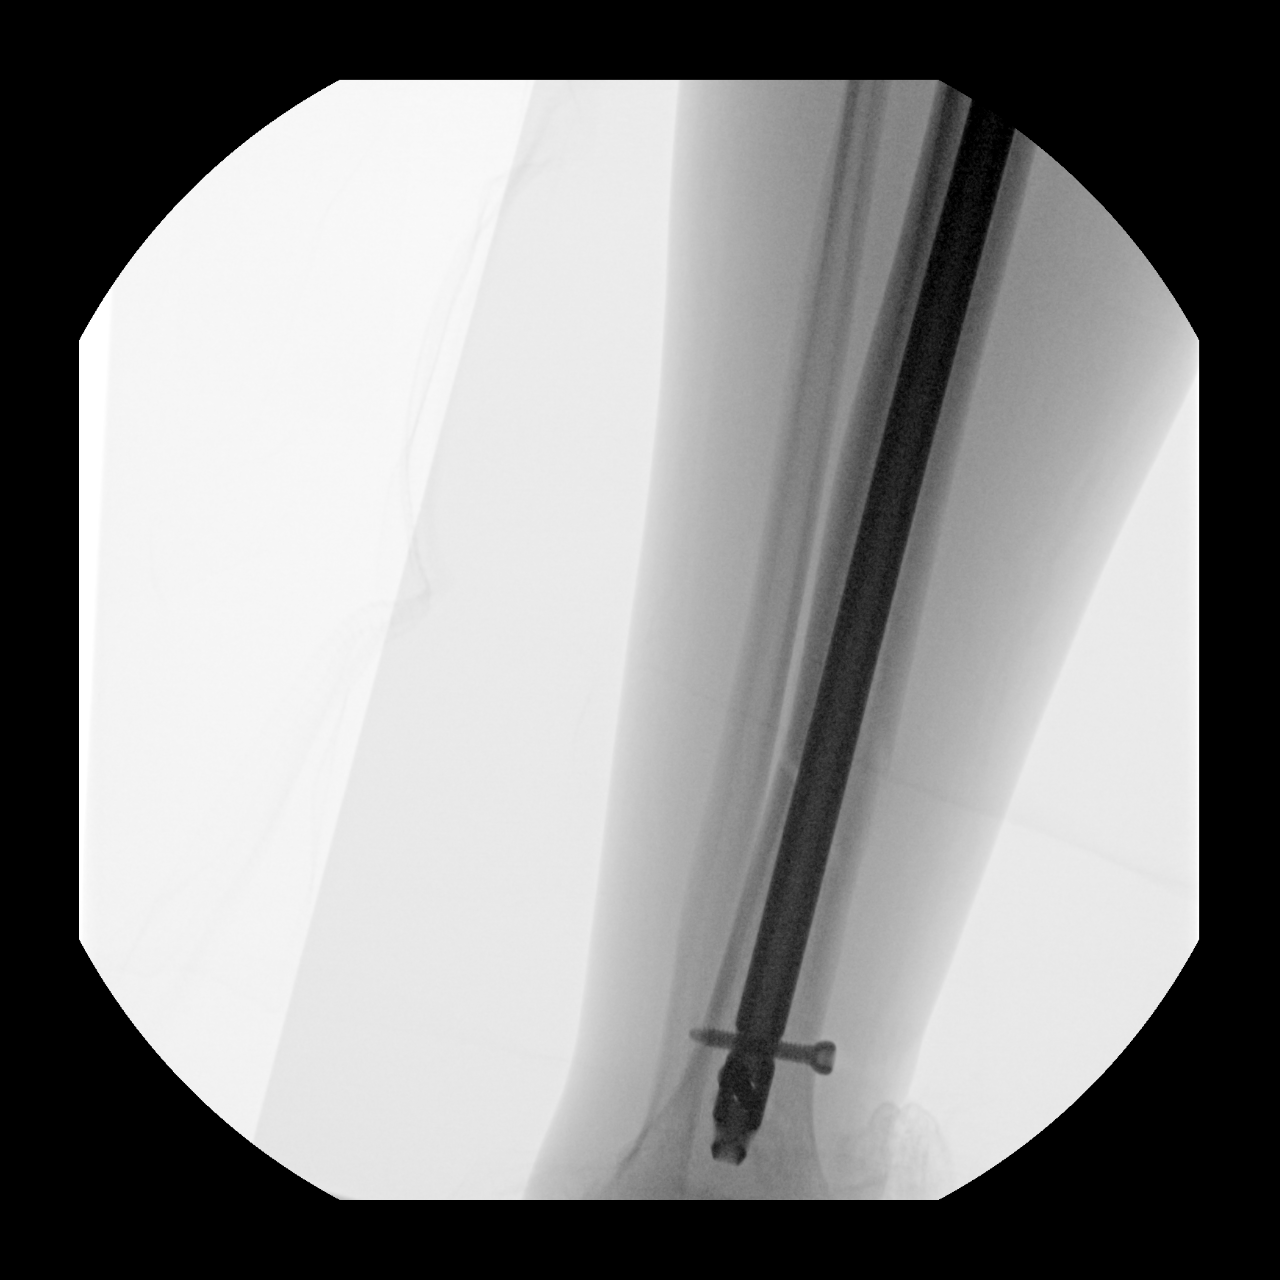
[im 2/2]
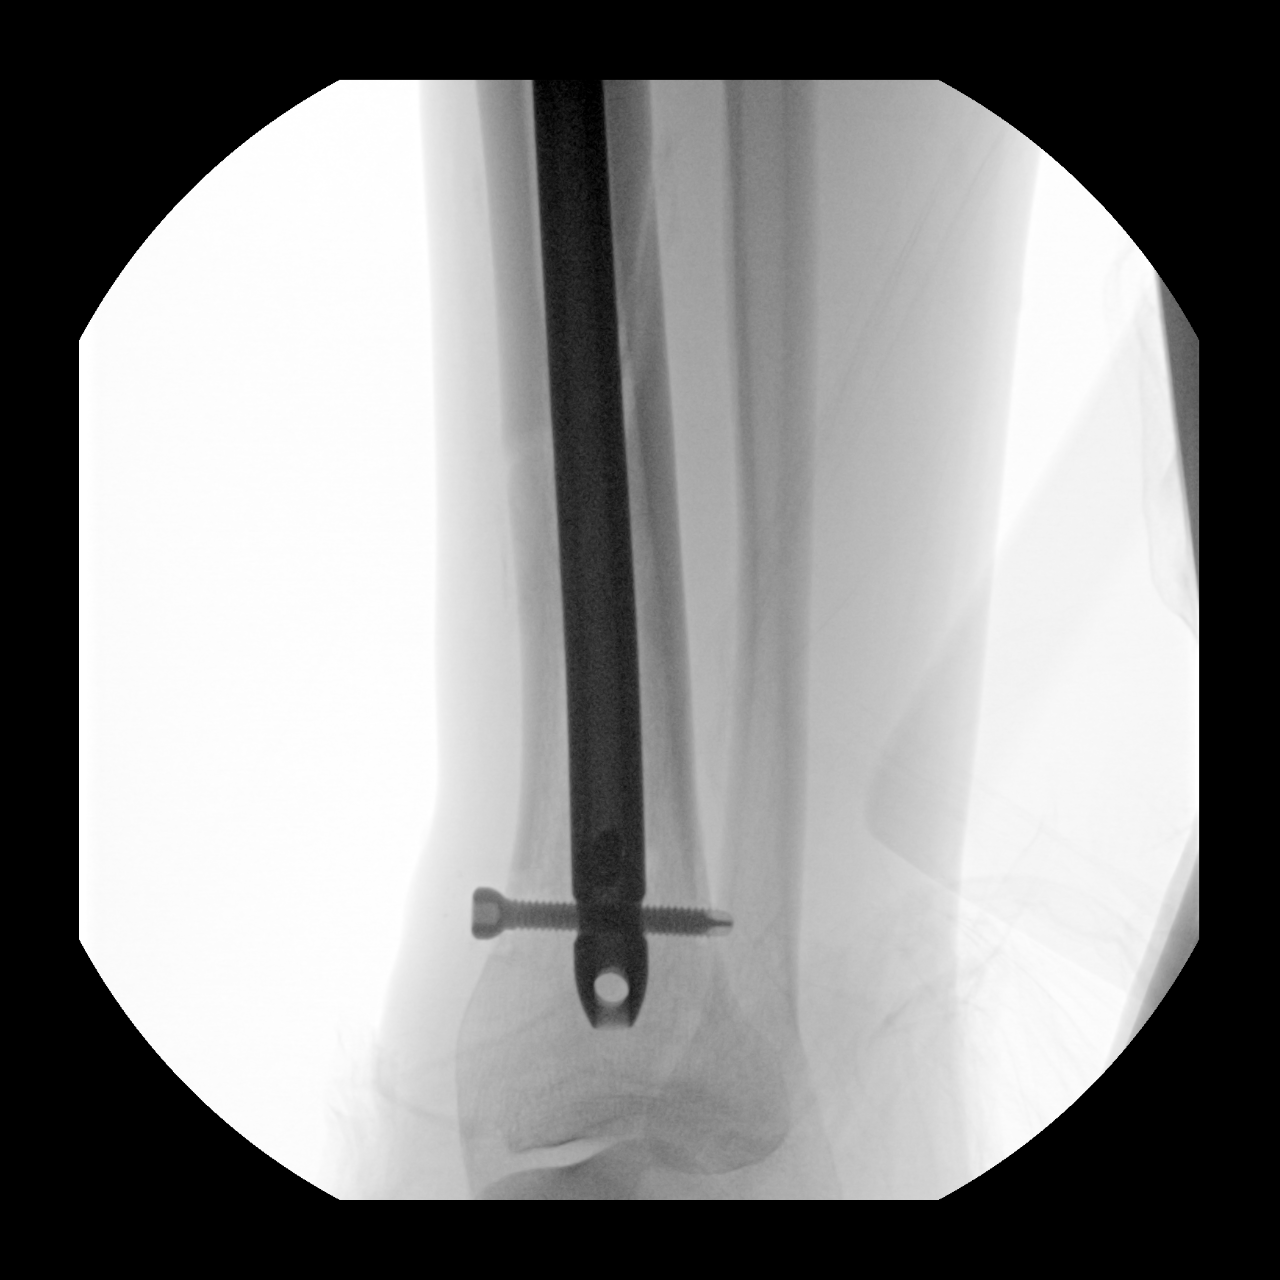

[2 of 2 positions shown; findings below may reference images not displayed]

FINDINGS: Two spot intraoperative fluoroscopic images of the distal aspect of
the right tibia are provided for review and demonstrate the sequela
of intramedullary rod fixation of known obliquely oriented distal
diaphyseal tibial fracture. Alignment appears anatomic. The distal
end of the intramedullary rod is transfixed with 2 perpendicularly
oriented cancellous screws. The proximal end of the tibial rod is
not imaged.
IMPRESSION: Post intramedullary rod fixation of distal diaphyseal tibial
fracture without evidence of complication.
